# Patient Record
Sex: Female | Born: 1980 | Race: White | Hispanic: No | Marital: Married | Smoking: Former smoker
Health system: Southern US, Community
[De-identification: ages and names within clinical notes are randomized; demographics above are authoritative.]

## PROBLEM LIST (undated history)

## (undated) DIAGNOSIS — G43909 Migraine, unspecified, not intractable, without status migrainosus: Secondary | ICD-10-CM

## (undated) DIAGNOSIS — G8929 Other chronic pain: Secondary | ICD-10-CM

## (undated) DIAGNOSIS — R002 Palpitations: Secondary | ICD-10-CM

## (undated) DIAGNOSIS — F1120 Opioid dependence, uncomplicated: Secondary | ICD-10-CM

## (undated) DIAGNOSIS — K3184 Gastroparesis: Secondary | ICD-10-CM

## (undated) DIAGNOSIS — G894 Chronic pain syndrome: Secondary | ICD-10-CM

## (undated) DIAGNOSIS — R079 Chest pain, unspecified: Secondary | ICD-10-CM

## (undated) DIAGNOSIS — R160 Hepatomegaly, not elsewhere classified: Secondary | ICD-10-CM

## (undated) DIAGNOSIS — F329 Major depressive disorder, single episode, unspecified: Secondary | ICD-10-CM

## (undated) DIAGNOSIS — F1910 Other psychoactive substance abuse, uncomplicated: Secondary | ICD-10-CM

## (undated) DIAGNOSIS — F32A Depression, unspecified: Secondary | ICD-10-CM

## (undated) DIAGNOSIS — G35 Multiple sclerosis: Secondary | ICD-10-CM

## (undated) DIAGNOSIS — Q249 Congenital malformation of heart, unspecified: Secondary | ICD-10-CM

## (undated) DIAGNOSIS — I4891 Unspecified atrial fibrillation: Secondary | ICD-10-CM

## (undated) HISTORY — PX: ABDOMINAL HYSTERECTOMY: SHX81

## (undated) HISTORY — PX: CHOLECYSTECTOMY: SHX55

## (undated) HISTORY — DX: Depression, unspecified: F32.A

## (undated) HISTORY — PX: APPENDECTOMY: SHX54

## (undated) HISTORY — PX: TUBAL LIGATION: SHX77

## (undated) HISTORY — PX: JOINT REPLACEMENT: SHX530

## (undated) HISTORY — DX: Migraine, unspecified, not intractable, without status migrainosus: G43.909

## (undated) HISTORY — PX: GALLBLADDER SURGERY: SHX652

## (undated) HISTORY — DX: Congenital malformation of heart, unspecified: Q24.9

## (undated) HISTORY — PX: SALIVARY GLAND SURGERY: SHX768

## (undated) HISTORY — DX: Major depressive disorder, single episode, unspecified: F32.9

---

## 2005-02-24 ENCOUNTER — Emergency Department (HOSPITAL_COMMUNITY): Admission: EM | Admit: 2005-02-24 | Discharge: 2005-02-25 | Payer: Self-pay | Admitting: *Deleted

## 2005-04-22 ENCOUNTER — Emergency Department (HOSPITAL_COMMUNITY): Admission: EM | Admit: 2005-04-22 | Discharge: 2005-04-22 | Payer: Self-pay | Admitting: Emergency Medicine

## 2005-07-10 ENCOUNTER — Ambulatory Visit: Payer: Self-pay | Admitting: Family Medicine

## 2005-07-10 ENCOUNTER — Inpatient Hospital Stay (HOSPITAL_COMMUNITY): Admission: AD | Admit: 2005-07-10 | Discharge: 2005-07-11 | Payer: Self-pay | Admitting: *Deleted

## 2005-08-01 ENCOUNTER — Ambulatory Visit: Payer: Self-pay | Admitting: *Deleted

## 2005-08-01 ENCOUNTER — Inpatient Hospital Stay (HOSPITAL_COMMUNITY): Admission: AD | Admit: 2005-08-01 | Discharge: 2005-08-01 | Payer: Self-pay | Admitting: Obstetrics & Gynecology

## 2005-08-10 ENCOUNTER — Inpatient Hospital Stay (HOSPITAL_COMMUNITY): Admission: AD | Admit: 2005-08-10 | Discharge: 2005-08-10 | Payer: Self-pay | Admitting: Obstetrics and Gynecology

## 2005-08-10 ENCOUNTER — Ambulatory Visit: Payer: Self-pay | Admitting: *Deleted

## 2005-09-08 ENCOUNTER — Other Ambulatory Visit: Admission: RE | Admit: 2005-09-08 | Discharge: 2005-09-08 | Payer: Self-pay | Admitting: Obstetrics and Gynecology

## 2005-09-16 ENCOUNTER — Inpatient Hospital Stay (HOSPITAL_COMMUNITY): Admission: AD | Admit: 2005-09-16 | Discharge: 2005-09-16 | Payer: Self-pay | Admitting: Obstetrics and Gynecology

## 2005-10-17 ENCOUNTER — Inpatient Hospital Stay (HOSPITAL_COMMUNITY): Admission: AD | Admit: 2005-10-17 | Discharge: 2005-10-17 | Payer: Self-pay | Admitting: Obstetrics and Gynecology

## 2005-10-22 ENCOUNTER — Inpatient Hospital Stay (HOSPITAL_COMMUNITY): Admission: AD | Admit: 2005-10-22 | Discharge: 2005-10-22 | Payer: Self-pay | Admitting: Obstetrics and Gynecology

## 2005-11-02 ENCOUNTER — Inpatient Hospital Stay (HOSPITAL_COMMUNITY): Admission: AD | Admit: 2005-11-02 | Discharge: 2005-11-05 | Payer: Self-pay | Admitting: Obstetrics and Gynecology

## 2005-11-03 ENCOUNTER — Encounter (INDEPENDENT_AMBULATORY_CARE_PROVIDER_SITE_OTHER): Payer: Self-pay | Admitting: Specialist

## 2005-12-06 ENCOUNTER — Ambulatory Visit (HOSPITAL_BASED_OUTPATIENT_CLINIC_OR_DEPARTMENT_OTHER): Admission: RE | Admit: 2005-12-06 | Discharge: 2005-12-06 | Payer: Self-pay | Admitting: Obstetrics and Gynecology

## 2006-01-31 ENCOUNTER — Emergency Department (HOSPITAL_COMMUNITY): Admission: EM | Admit: 2006-01-31 | Discharge: 2006-01-31 | Payer: Self-pay | Admitting: *Deleted

## 2006-04-27 ENCOUNTER — Emergency Department (HOSPITAL_COMMUNITY): Admission: EM | Admit: 2006-04-27 | Discharge: 2006-04-27 | Payer: Self-pay | Admitting: Emergency Medicine

## 2006-07-09 ENCOUNTER — Emergency Department (HOSPITAL_COMMUNITY): Admission: EM | Admit: 2006-07-09 | Discharge: 2006-07-09 | Payer: Self-pay | Admitting: Emergency Medicine

## 2006-09-12 ENCOUNTER — Emergency Department (HOSPITAL_COMMUNITY): Admission: EM | Admit: 2006-09-12 | Discharge: 2006-09-12 | Payer: Self-pay | Admitting: Emergency Medicine

## 2006-10-17 ENCOUNTER — Inpatient Hospital Stay (HOSPITAL_COMMUNITY): Admission: AD | Admit: 2006-10-17 | Discharge: 2006-10-17 | Payer: Self-pay | Admitting: Obstetrics and Gynecology

## 2007-01-07 ENCOUNTER — Emergency Department (HOSPITAL_COMMUNITY): Admission: EM | Admit: 2007-01-07 | Discharge: 2007-01-07 | Payer: Self-pay | Admitting: Emergency Medicine

## 2007-01-09 ENCOUNTER — Emergency Department (HOSPITAL_COMMUNITY): Admission: EM | Admit: 2007-01-09 | Discharge: 2007-01-09 | Payer: Self-pay | Admitting: Emergency Medicine

## 2007-04-22 ENCOUNTER — Inpatient Hospital Stay (HOSPITAL_COMMUNITY): Admission: EM | Admit: 2007-04-22 | Discharge: 2007-04-23 | Payer: Self-pay | Admitting: Emergency Medicine

## 2007-04-22 ENCOUNTER — Ambulatory Visit: Payer: Self-pay | Admitting: Family Medicine

## 2007-06-21 ENCOUNTER — Emergency Department (HOSPITAL_COMMUNITY): Admission: EM | Admit: 2007-06-21 | Discharge: 2007-06-21 | Payer: Self-pay | Admitting: Emergency Medicine

## 2007-07-04 ENCOUNTER — Emergency Department (HOSPITAL_COMMUNITY): Admission: EM | Admit: 2007-07-04 | Discharge: 2007-07-04 | Payer: Self-pay | Admitting: Emergency Medicine

## 2007-11-05 ENCOUNTER — Emergency Department (HOSPITAL_COMMUNITY): Admission: EM | Admit: 2007-11-05 | Discharge: 2007-11-05 | Payer: Self-pay | Admitting: Emergency Medicine

## 2007-11-25 ENCOUNTER — Emergency Department (HOSPITAL_COMMUNITY): Admission: EM | Admit: 2007-11-25 | Discharge: 2007-11-25 | Payer: Self-pay | Admitting: Emergency Medicine

## 2008-01-23 ENCOUNTER — Emergency Department (HOSPITAL_COMMUNITY): Admission: EM | Admit: 2008-01-23 | Discharge: 2008-01-23 | Payer: Self-pay | Admitting: Emergency Medicine

## 2008-01-29 ENCOUNTER — Emergency Department (HOSPITAL_COMMUNITY): Admission: EM | Admit: 2008-01-29 | Discharge: 2008-01-29 | Payer: Self-pay | Admitting: Emergency Medicine

## 2008-02-11 ENCOUNTER — Emergency Department (HOSPITAL_COMMUNITY): Admission: EM | Admit: 2008-02-11 | Discharge: 2008-02-11 | Payer: Self-pay | Admitting: Emergency Medicine

## 2008-02-26 ENCOUNTER — Emergency Department (HOSPITAL_COMMUNITY): Admission: EM | Admit: 2008-02-26 | Discharge: 2008-02-26 | Payer: Self-pay | Admitting: Emergency Medicine

## 2008-05-19 ENCOUNTER — Emergency Department (HOSPITAL_BASED_OUTPATIENT_CLINIC_OR_DEPARTMENT_OTHER): Admission: EM | Admit: 2008-05-19 | Discharge: 2008-05-19 | Payer: Self-pay | Admitting: Emergency Medicine

## 2008-08-11 ENCOUNTER — Emergency Department (HOSPITAL_BASED_OUTPATIENT_CLINIC_OR_DEPARTMENT_OTHER): Admission: EM | Admit: 2008-08-11 | Discharge: 2008-08-11 | Payer: Self-pay | Admitting: Emergency Medicine

## 2008-09-18 ENCOUNTER — Emergency Department (HOSPITAL_BASED_OUTPATIENT_CLINIC_OR_DEPARTMENT_OTHER): Admission: EM | Admit: 2008-09-18 | Discharge: 2008-09-18 | Payer: Self-pay | Admitting: Emergency Medicine

## 2008-11-11 ENCOUNTER — Emergency Department (HOSPITAL_BASED_OUTPATIENT_CLINIC_OR_DEPARTMENT_OTHER): Admission: EM | Admit: 2008-11-11 | Discharge: 2008-11-11 | Payer: Self-pay | Admitting: Emergency Medicine

## 2009-01-21 ENCOUNTER — Emergency Department (HOSPITAL_COMMUNITY): Admission: EM | Admit: 2009-01-21 | Discharge: 2009-01-21 | Payer: Self-pay | Admitting: Emergency Medicine

## 2009-02-20 ENCOUNTER — Emergency Department (HOSPITAL_BASED_OUTPATIENT_CLINIC_OR_DEPARTMENT_OTHER): Admission: EM | Admit: 2009-02-20 | Discharge: 2009-02-20 | Payer: Self-pay | Admitting: Emergency Medicine

## 2009-03-02 ENCOUNTER — Emergency Department (HOSPITAL_BASED_OUTPATIENT_CLINIC_OR_DEPARTMENT_OTHER): Admission: EM | Admit: 2009-03-02 | Discharge: 2009-03-02 | Payer: Self-pay | Admitting: Emergency Medicine

## 2009-05-11 ENCOUNTER — Emergency Department (HOSPITAL_BASED_OUTPATIENT_CLINIC_OR_DEPARTMENT_OTHER): Admission: EM | Admit: 2009-05-11 | Discharge: 2009-05-11 | Payer: Self-pay | Admitting: Emergency Medicine

## 2009-05-15 ENCOUNTER — Ambulatory Visit: Payer: Self-pay | Admitting: Diagnostic Radiology

## 2009-05-15 ENCOUNTER — Emergency Department (HOSPITAL_BASED_OUTPATIENT_CLINIC_OR_DEPARTMENT_OTHER): Admission: EM | Admit: 2009-05-15 | Discharge: 2009-05-15 | Payer: Self-pay | Admitting: Emergency Medicine

## 2009-06-11 ENCOUNTER — Emergency Department (HOSPITAL_BASED_OUTPATIENT_CLINIC_OR_DEPARTMENT_OTHER): Admission: EM | Admit: 2009-06-11 | Discharge: 2009-06-11 | Payer: Self-pay | Admitting: Emergency Medicine

## 2009-06-11 ENCOUNTER — Ambulatory Visit: Payer: Self-pay | Admitting: Diagnostic Radiology

## 2009-06-30 ENCOUNTER — Ambulatory Visit: Payer: Self-pay | Admitting: Diagnostic Radiology

## 2009-06-30 ENCOUNTER — Emergency Department (HOSPITAL_BASED_OUTPATIENT_CLINIC_OR_DEPARTMENT_OTHER): Admission: EM | Admit: 2009-06-30 | Discharge: 2009-06-30 | Payer: Self-pay | Admitting: Emergency Medicine

## 2009-10-04 ENCOUNTER — Ambulatory Visit: Payer: Self-pay | Admitting: Diagnostic Radiology

## 2009-10-04 ENCOUNTER — Emergency Department (HOSPITAL_BASED_OUTPATIENT_CLINIC_OR_DEPARTMENT_OTHER): Admission: EM | Admit: 2009-10-04 | Discharge: 2009-10-04 | Payer: Self-pay | Admitting: Emergency Medicine

## 2009-12-08 ENCOUNTER — Ambulatory Visit: Payer: Self-pay | Admitting: Radiology

## 2009-12-08 ENCOUNTER — Emergency Department (HOSPITAL_BASED_OUTPATIENT_CLINIC_OR_DEPARTMENT_OTHER): Admission: EM | Admit: 2009-12-08 | Discharge: 2009-12-08 | Payer: Self-pay | Admitting: Emergency Medicine

## 2010-01-22 ENCOUNTER — Emergency Department (HOSPITAL_BASED_OUTPATIENT_CLINIC_OR_DEPARTMENT_OTHER): Admission: EM | Admit: 2010-01-22 | Discharge: 2010-01-22 | Payer: Self-pay | Admitting: Emergency Medicine

## 2010-04-04 ENCOUNTER — Emergency Department (HOSPITAL_BASED_OUTPATIENT_CLINIC_OR_DEPARTMENT_OTHER): Admission: EM | Admit: 2010-04-04 | Discharge: 2010-04-04 | Payer: Self-pay | Admitting: Emergency Medicine

## 2010-08-12 ENCOUNTER — Ambulatory Visit (HOSPITAL_COMMUNITY): Admission: RE | Admit: 2010-08-12 | Discharge: 2010-08-12 | Payer: Self-pay | Admitting: Family Medicine

## 2010-09-30 ENCOUNTER — Emergency Department (HOSPITAL_BASED_OUTPATIENT_CLINIC_OR_DEPARTMENT_OTHER): Admission: EM | Admit: 2010-09-30 | Discharge: 2010-09-30 | Payer: Self-pay | Admitting: Emergency Medicine

## 2010-11-04 ENCOUNTER — Emergency Department (HOSPITAL_COMMUNITY)
Admission: EM | Admit: 2010-11-04 | Discharge: 2010-11-04 | Payer: Self-pay | Source: Home / Self Care | Admitting: Emergency Medicine

## 2010-12-12 ENCOUNTER — Encounter: Payer: Self-pay | Admitting: Orthopedic Surgery

## 2010-12-13 ENCOUNTER — Ambulatory Visit: Payer: Self-pay | Admitting: Cardiology

## 2010-12-21 ENCOUNTER — Emergency Department (INDEPENDENT_AMBULATORY_CARE_PROVIDER_SITE_OTHER)
Admission: EM | Admit: 2010-12-21 | Discharge: 2010-12-21 | Payer: Self-pay | Source: Home / Self Care | Admitting: Emergency Medicine

## 2010-12-21 DIAGNOSIS — H9209 Otalgia, unspecified ear: Secondary | ICD-10-CM

## 2010-12-21 DIAGNOSIS — R0989 Other specified symptoms and signs involving the circulatory and respiratory systems: Secondary | ICD-10-CM

## 2010-12-21 DIAGNOSIS — R05 Cough: Secondary | ICD-10-CM

## 2011-01-31 LAB — URINALYSIS, ROUTINE W REFLEX MICROSCOPIC
Bilirubin Urine: NEGATIVE
Glucose, UA: NEGATIVE mg/dL
Hgb urine dipstick: NEGATIVE
Ketones, ur: NEGATIVE mg/dL
Protein, ur: NEGATIVE mg/dL
Urobilinogen, UA: 0.2 mg/dL (ref 0.0–1.0)

## 2011-01-31 LAB — CBC
Hemoglobin: 13 g/dL (ref 12.0–15.0)
MCHC: 33.2 g/dL (ref 30.0–36.0)
RDW: 14.1 % (ref 11.5–15.5)

## 2011-01-31 LAB — POCT I-STAT, CHEM 8
Chloride: 109 mEq/L (ref 96–112)
Hemoglobin: 13.6 g/dL (ref 12.0–15.0)
Potassium: 4.5 mEq/L (ref 3.5–5.1)
Sodium: 141 mEq/L (ref 135–145)
TCO2: 24 mmol/L (ref 0–100)

## 2011-01-31 LAB — DIFFERENTIAL
Basophils Absolute: 0.1 10*3/uL (ref 0.0–0.1)
Basophils Relative: 1 % (ref 0–1)
Neutro Abs: 5 10*3/uL (ref 1.7–7.7)
Neutrophils Relative %: 49 % (ref 43–77)

## 2011-02-06 LAB — URINE MICROSCOPIC-ADD ON

## 2011-02-06 LAB — COMPREHENSIVE METABOLIC PANEL
Albumin: 4.6 g/dL (ref 3.5–5.2)
BUN: 9 mg/dL (ref 6–23)
Calcium: 9 mg/dL (ref 8.4–10.5)
Chloride: 104 mEq/L (ref 96–112)
Creatinine, Ser: 0.7 mg/dL (ref 0.4–1.2)
GFR calc non Af Amer: >60 mL/min (ref >60)
Total Bilirubin: 0.3 mg/dL (ref 0.3–1.2)

## 2011-02-06 LAB — URINALYSIS, ROUTINE W REFLEX MICROSCOPIC
Leukocytes, UA: NEGATIVE
Nitrite: NEGATIVE
Specific Gravity, Urine: 1.014 (ref 1.005–1.030)
pH: 6.5 (ref 5.0–8.0)

## 2011-02-06 LAB — DIFFERENTIAL
Basophils Absolute: 0.1 10*3/uL (ref 0.0–0.1)
Lymphocytes Relative: 29 % (ref 12–46)
Lymphs Abs: 3.2 10*3/uL (ref 0.7–4.0)
Monocytes Absolute: 0.8 10*3/uL (ref 0.1–1.0)
Neutro Abs: 6.6 10*3/uL (ref 1.7–7.7)

## 2011-02-06 LAB — CBC
HCT: 41.2 % (ref 36.0–46.0)
MCHC: 34 g/dL (ref 30.0–36.0)
MCV: 88.4 fL (ref 78.0–100.0)
Platelets: 253 10*3/uL (ref 150–400)
WBC: 11.1 10*3/uL — ABNORMAL HIGH (ref 4.0–10.5)

## 2011-02-26 LAB — URINALYSIS, ROUTINE W REFLEX MICROSCOPIC
Bilirubin Urine: NEGATIVE
Ketones, ur: NEGATIVE mg/dL
Nitrite: NEGATIVE
Protein, ur: NEGATIVE mg/dL
Urobilinogen, UA: 1 mg/dL (ref 0.0–1.0)

## 2011-02-26 LAB — COMPREHENSIVE METABOLIC PANEL
AST: 35 U/L (ref 0–37)
CO2: 32 mEq/L (ref 19–32)
Chloride: 105 mEq/L (ref 96–112)
Creatinine, Ser: 0.7 mg/dL (ref 0.4–1.2)
GFR calc Af Amer: >60 mL/min (ref >60)
GFR calc non Af Amer: >60 mL/min (ref >60)
Total Bilirubin: 0.2 mg/dL — ABNORMAL LOW (ref 0.3–1.2)

## 2011-02-26 LAB — DIFFERENTIAL
Basophils Absolute: 0.1 10*3/uL (ref 0.0–0.1)
Lymphocytes Relative: 45 % (ref 12–46)
Monocytes Absolute: 0.9 10*3/uL (ref 0.1–1.0)
Neutro Abs: 3.7 10*3/uL (ref 1.7–7.7)

## 2011-02-26 LAB — LIPASE, BLOOD: Lipase: 92 U/L (ref 23–300)

## 2011-02-26 LAB — CBC
HCT: 40.9 % (ref 36.0–46.0)
MCV: 88.8 fL (ref 78.0–100.0)
RBC: 4.61 MIL/uL (ref 3.87–5.11)
WBC: 9.9 10*3/uL (ref 4.0–10.5)

## 2011-02-26 LAB — PREGNANCY, URINE: Preg Test, Ur: NEGATIVE

## 2011-02-26 LAB — URINE CULTURE: Colony Count: 5000

## 2011-02-27 LAB — DIFFERENTIAL
Basophils Absolute: 0.1 10*3/uL (ref 0.0–0.1)
Eosinophils Absolute: 0.5 10*3/uL (ref 0.0–0.7)
Eosinophils Relative: 6 % — ABNORMAL HIGH (ref 0–5)
Monocytes Absolute: 1 10*3/uL (ref 0.1–1.0)

## 2011-02-27 LAB — COMPREHENSIVE METABOLIC PANEL
ALT: 14 U/L (ref 0–35)
AST: 30 U/L (ref 0–37)
CO2: 29 mEq/L (ref 19–32)
Chloride: 105 mEq/L (ref 96–112)
GFR calc Af Amer: >60 mL/min (ref >60)
GFR calc non Af Amer: >60 mL/min (ref >60)
Potassium: 4.1 mEq/L (ref 3.5–5.1)
Sodium: 139 mEq/L (ref 135–145)
Total Bilirubin: 0.2 mg/dL — ABNORMAL LOW (ref 0.3–1.2)

## 2011-02-27 LAB — D-DIMER, QUANTITATIVE: D-Dimer, Quant: <0.22 ug/mL-FEU (ref 0.00–0.48)

## 2011-02-27 LAB — CBC
RBC: 4.85 MIL/uL (ref 3.87–5.11)
WBC: 8.8 10*3/uL (ref 4.0–10.5)

## 2011-02-28 LAB — CBC
HCT: 39 % (ref 36.0–46.0)
HCT: 41.8 % (ref 36.0–46.0)
Hemoglobin: 14.4 g/dL (ref 12.0–15.0)
MCHC: 35.2 g/dL (ref 30.0–36.0)
MCV: 89 fL (ref 78.0–100.0)
Platelets: 242 10*3/uL (ref 150–400)
RBC: 4.7 MIL/uL (ref 3.87–5.11)
RDW: 13.3 % (ref 11.5–15.5)
WBC: 12 10*3/uL — ABNORMAL HIGH (ref 4.0–10.5)

## 2011-02-28 LAB — URINALYSIS, ROUTINE W REFLEX MICROSCOPIC
Bilirubin Urine: NEGATIVE
Ketones, ur: NEGATIVE mg/dL
Protein, ur: NEGATIVE mg/dL
Urobilinogen, UA: 0.2 mg/dL (ref 0.0–1.0)

## 2011-02-28 LAB — BASIC METABOLIC PANEL
Chloride: 102 mEq/L (ref 96–112)
GFR calc Af Amer: >60 mL/min (ref >60)
GFR calc non Af Amer: >60 mL/min (ref >60)
Potassium: 3.9 mEq/L (ref 3.5–5.1)
Sodium: 141 mEq/L (ref 135–145)

## 2011-02-28 LAB — URINE MICROSCOPIC-ADD ON

## 2011-02-28 LAB — DIFFERENTIAL
Basophils Absolute: 0.1 10*3/uL (ref 0.0–0.1)
Basophils Relative: 1 % (ref 0–1)
Eosinophils Absolute: 0.4 10*3/uL (ref 0.0–0.7)
Eosinophils Relative: 3 % (ref 0–5)
Eosinophils Relative: 5 % (ref 0–5)
Lymphocytes Relative: 30 % (ref 12–46)
Lymphocytes Relative: 37 % (ref 12–46)
Lymphs Abs: 3.6 10*3/uL (ref 0.7–4.0)
Monocytes Relative: 6 % (ref 3–12)
Neutro Abs: 4.3 10*3/uL (ref 1.7–7.7)

## 2011-02-28 LAB — RAPID STREP SCREEN (MED CTR MEBANE ONLY): Streptococcus, Group A Screen (Direct): NEGATIVE

## 2011-03-03 LAB — COMPREHENSIVE METABOLIC PANEL
ALT: 19 U/L (ref 0–35)
AST: 19 U/L (ref 0–37)
Albumin: 3.9 g/dL (ref 3.5–5.2)
Alkaline Phosphatase: 72 U/L (ref 39–117)
BUN: 9 mg/dL (ref 6–23)
CO2: 27 mEq/L (ref 19–32)
Calcium: 9.5 mg/dL (ref 8.4–10.5)
Chloride: 104 mEq/L (ref 96–112)
Creatinine, Ser: 0.6 mg/dL (ref 0.4–1.2)
GFR calc Af Amer: >60 mL/min (ref >60)
GFR calc non Af Amer: >60 mL/min (ref >60)
Glucose, Bld: 96 mg/dL (ref 70–99)
Potassium: 3.7 mEq/L (ref 3.5–5.1)
Sodium: 139 mEq/L (ref 135–145)
Total Bilirubin: 0.6 mg/dL (ref 0.3–1.2)
Total Protein: 6.7 g/dL (ref 6.0–8.3)

## 2011-03-03 LAB — URINE MICROSCOPIC-ADD ON

## 2011-03-03 LAB — DIFFERENTIAL
Basophils Absolute: 0 10*3/uL (ref 0.0–0.1)
Basophils Relative: 1 % (ref 0–1)
Eosinophils Absolute: 0.3 10*3/uL (ref 0.0–0.7)
Eosinophils Relative: 3 % (ref 0–5)
Lymphocytes Relative: 27 % (ref 12–46)
Lymphs Abs: 2.6 10*3/uL (ref 0.7–4.0)
Monocytes Absolute: 0.9 10*3/uL (ref 0.1–1.0)
Monocytes Relative: 9 % (ref 3–12)
Neutro Abs: 5.9 10*3/uL (ref 1.7–7.7)
Neutrophils Relative %: 61 % (ref 43–77)

## 2011-03-03 LAB — WET PREP, GENITAL
Trich, Wet Prep: NONE SEEN
Yeast Wet Prep HPF POC: NONE SEEN

## 2011-03-03 LAB — CBC
HCT: 40.5 % (ref 36.0–46.0)
Hemoglobin: 13.7 g/dL (ref 12.0–15.0)
MCHC: 33.8 g/dL (ref 30.0–36.0)
MCV: 89.6 fL (ref 78.0–100.0)
Platelets: 337 10*3/uL (ref 150–400)
RBC: 4.52 MIL/uL (ref 3.87–5.11)
RDW: 14.7 % (ref 11.5–15.5)
WBC: 9.7 10*3/uL (ref 4.0–10.5)

## 2011-03-03 LAB — URINALYSIS, ROUTINE W REFLEX MICROSCOPIC
Bilirubin Urine: NEGATIVE
Glucose, UA: NEGATIVE mg/dL
Hgb urine dipstick: NEGATIVE
Ketones, ur: NEGATIVE mg/dL
Leukocytes, UA: NEGATIVE
Nitrite: NEGATIVE
Protein, ur: NEGATIVE mg/dL
Specific Gravity, Urine: 1.018 (ref 1.005–1.030)
Urobilinogen, UA: 0.2 mg/dL (ref 0.0–1.0)
pH: 7.5 (ref 5.0–8.0)

## 2011-03-03 LAB — POCT I-STAT, CHEM 8
BUN: 12 mg/dL (ref 6–23)
Calcium, Ion: 1.19 mmol/L (ref 1.12–1.32)
Creatinine, Ser: 0.6 mg/dL (ref 0.4–1.2)
Glucose, Bld: 84 mg/dL (ref 70–99)
Sodium: 140 mEq/L (ref 135–145)
TCO2: 27 mmol/L (ref 0–100)

## 2011-04-05 NOTE — Discharge Summary (Signed)
[name], Teresa Ochoa              ACCOUNT NO.:  [id number]   MEDICAL RECORD NO.:  [id number]          PATIENT TYPE:  INP   LOCATION:  5713                         FACILITY:  [facility]   PHYSICIAN:  Levander Campion, M.D.  DATE OF BIRTH:  1981/09/16   DATE OF ADMISSION:  04/22/2007  DATE OF DISCHARGE:  04/23/2007                               DISCHARGE SUMMARY   DISCHARGE DIAGNOSES:  1. Constipation.  2. Anxiety.  3. Panic attacks.   DISCHARGE MEDICATIONS:  1. MiraLax 17 g in 8 ounces of clear liquid twice daily.  The patient      was instructed that she can titrate this up or down as needed to      regulate her bowel movements.  2. Percocet 5/325 one to two tabs every 4 to 6 hours as needed for      pain.  3. Ibuprofen 800 mg 3 times daily as needed for pain.  4. Nicotine patch.  Apply new patch daily.  5. Klonopin 0.5 mg daily as needed for anxiety.   DISCHARGE INSTRUCTIONS:  The patient was discharged home with  instructions to comply with a high-fiber diet.  She is to follow up with  [facility] within 2 weeks of discharge.   STUDIES:  CT of pelvis and abdomen was unremarkable except for a copious  amount of stool.   LABORATORY DATA:  On admission, her sed rate was 8.  The patient was  fecal occult blood negative.  She had a white blood count of 13,  hemoglobin of 14, hematocrit of 42.1, platelets of 300.  Lipase of 25.  Sodium 138, potassium 4.5, chloride 101, bicarbonate of 25, BUN of 10,  creatinine of 0.6, glucose of 117.  UA was within normal limits.   HOSPITAL COURSE:  Ms. Harle Battiest is a 30 year old female who has a history  of constipation and anxiety, who presented with abdominal pain.  The  patient had negative KUB and negative CT scan.  The patient's CT scan  did show a copious amount of stool in the colon, and the patient had  been on a pain medication regimen that could contribute to constipation.  The patient was given multiple milk of molasses  enema and MiraLax  titrated up to 17 g b.i.d. and clear liquid diet.  After this bowel  regimen, she successfully had multiple bowel movements with relief of  her pain, and she was discharged home with instructions to follow a high-  fiber diet and to use MiraLax to adjust her bowel movement pattern.   1. Anxiety:  The patient was continued on her home anti-anxiety      medication of Klonopin.   1. Tobacco abuse:  Smoking cessation was addressed during this      hospital stay, and the patient was discharged home with a nicotine      patch.      Levander Campion, M.D.  Electronically Signed     JH/MEDQ  D:  07/02/2007  T:  07/02/2007  Job:  [id number]

## 2011-04-08 NOTE — Op Note (Signed)
NAME:  LONDYNN, SONODA              ACCOUNT NO.:  [id number]   MEDICAL RECORD NO.:  [id number]          PATIENT TYPE:  AMB   LOCATION:  NESC                         FACILITY:  [facility]   PHYSICIAN:  Malachi Pro. Ambrose Mantle, M.D. DATE OF BIRTH:  1981/01/02   DATE OF PROCEDURE:  12/06/2005  DATE OF DISCHARGE:                                 OPERATIVE REPORT   PREOPERATIVE DIAGNOSIS:  Voluntary sterilization.   POSTOPERATIVE DIAGNOSIS:  Voluntary sterilization.   OPERATION:  Laparoscopic tubal cauterization by open laparoscopy.   OPERATOR:  Malachi Pro. Ambrose Mantle, M.D.   ANESTHESIA:  General anesthesia.   The patient was brought to the operating room and placed under satisfactory  general anesthesia and placed in [place] stirrups. The abdomen, vulva, vagina  and urethra were prepped with Betadine solution and the bladder was emptied  with a [place] catheter. A Hulka cannula was placed into the uterus and  attached to the anterior cervical lip. The uterus was anterior upper limit  of normal size because of her recent postpartum state. The adnexa were free  of masses. The abdomen was then draped as a sterile field. A small incision  was made in the inferior portion of the umbilicus, taken down to the fascia.  The fascia was elevated with Kocher clamps, incised and then the peritoneum  was opened with my finger. I inserted the Hassan cannula, filled the abdomen  with CO2 and had good visualization of the pelvic contents. The uterus was  anterior upper limit of normal size. The cul-de-sacs were free of disease.  Both tubes and ovaries were normal. The right tube was grasped and  cauterized in four consecutive locations starting at the mid portion of the  tube and going toward the cornu with a setting of 4 and the cauterization  was continued until the meter read zero. The same procedure was done on the  left side, no complications were encountered. The laparoscope was turned  superiorly, there were  adhesions of what appeared to be the cecal area to  the abdominal wall. These were not ligated or divided. The liver looks  smooth. The gallbladder appeared normal. No upper abdominal abnormalities  were noted. The small trocar placed under direct vision suprapubically prior  to the cauterization was removed under direct vision, there was no bleeding.  I then removed the open laparoscopic trocar and pulled out the pursestring  suture of #0 Vicryl that had been used to anchor the McGuffey and closed the  abdominal fascia with three figure-of-eight sutures of #0 Vicryl.   The subcu tissue was then closed with 3-0 Vicryl. The skin was closed with  interrupted 3-0 plain catgut. The patient seemed to tolerate the procedure  well. Blood loss was less than 10 mL. Sponge and needle counts were correct  and the patient was returned to recovery in satisfactory condition.      Malachi Pro. Ambrose Mantle, M.D.  Electronically Signed    TFH/MEDQ  D:  12/06/2005  T:  12/06/2005  Job:  [id number]

## 2011-04-08 NOTE — Discharge Summary (Signed)
NAMEMICHALLA, RINGER              ACCOUNT NO.:  [id number]   MEDICAL RECORD NO.:  [id number]          PATIENT TYPE:  INP   LOCATION:  9123                          FACILITY:  [facility]   PHYSICIAN:  Huel Cote, M.D. DATE OF BIRTH:  [birth year]   DATE OF ADMISSION:  11/02/2005  DATE OF DISCHARGE:  11/05/2005                                 DISCHARGE SUMMARY   DISCHARGE MEDICATIONS:  1.  Motrin 600 mg p.o. every six hours.  2.  Percocet one to two tablets p.o. every four hours p.r.n.   DISCHARGE FOLLOWUP:  The patient is to follow up in the office in six weeks  for her routine postpartum exam   HOSPITAL COURSE:  The patient is 30 year old, G9, P2-0-5-2, who was admitted  at 20 weeks' gestation by her only possible date of conception with an [facility]  of November 01, 2005.  She had two later ultrasounds at 13 and 20 weeks  which made her approximately 37 to 38 weeks' gestation.  The patient is  miserable with pain and pressure and had to arrange for childcare 200 miles  away for delivery and therefore, requested with a social induction which was  granted.  The patient transferred into our care at 28 weeks' gestation and  prenatal care was essentially uncomplicated, except for her severe pain and  pressure.  Prenatal labs as follows:  A positive.  RPR nonreactive.  Rubella immune.  Hepatitis B surface antigen negative.  HIV negative. GC and Chlamydia  negative.  One-hour Glucola 95 and GBS positive.   PAST OBSTETRICAL HISTORY:  1.  She had had five spontaneous miscarriages.  2.  She had two spontaneous vaginal deliveries, one 7 pounds 11 ounces, one      10 pounds 3 ounces.  3.  She also had a preterm IUFD at 27 weeks which was a vaginal delivery.   PAST MEDICAL HISTORY:  1.  UTIs.  2.  Anxiety.  3.  Depression.  4.  Migraines.   PAST SURGICAL HISTORY:  Appendectomy.   ALLERGIES:  MACROBID.   MEDICATIONS:  Darvocet.   PHYSICAL EXAMINATION:  VITAL SIGNS:  She was afebrile with  stable vital  signs.  Fetal heart tones were reactive.  PELVIC:  Cervix was 31 at a -3.   She was admitted for delivery and I explained the risk of possible fetal  lung immaturity should her estimated due date really be closer to 37 weeks,  and was admitted for Cervidil ripening.  The patient did well overnight and  cervix the a.m. following Cervidil ripening was 2-to-3-cm.  She had rupture  of membranes performed with clear fluid noted and was placed on Pitocin.  She then progressed to complete dilation and delivered a spontaneous vaginal  delivery over an intact perineum, a female infant 7 pounds 11 ounces,  Apgars were 7 and 8.  Placenta delivered intact.  The cord pH was noted to  be 7.31.  The patient requested bilateral tubal ligation, however, had not signed her  Medicaid papers in a timely fashion and therefore, this was postponed until  her six-week appointment.  She was admitted for routine postpartum care.  She did very well.  On  postpartum day #2, she was doing well and felt stable for discharge home.  Her hemoglobin was 8.6 and she had no further complaints; therefore, she was  discharged home with prescriptions for Motrin and Percocet and will follow  up in the office in six weeks.      Huel Cote, M.D.  Electronically Signed     KR/MEDQ  D:  11/05/2005  T:  11/06/2005  Job:  [id number]

## 2011-04-08 NOTE — H&P (Signed)
Teresa Ochoa, Teresa Ochoa              ACCOUNT NO.:  [id number]   MEDICAL RECORD NO.:  [id number]          PATIENT TYPE:  INP   LOCATION:  9123                          FACILITY:  [facility]   PHYSICIAN:  Malachi Pro. Ambrose Mantle, M.D. DATE OF BIRTH:  December 30, 1980   DATE OF ADMISSION:  11/02/2005  DATE OF DISCHARGE:                                HISTORY & PHYSICAL   HISTORY OF PRESENT ILLNESS:  The patient is a 30 year old white female, para  2-1-5-2, gravida 60, who was admitted by Dr. Jackelyn Knife for induction of labor  because of multiple social problems. Her blood group and type was A  positive, negative antibody, RPR non-reactive, rubella immune, hepatitis B  surface antigen negative, HIV negative, GC and Chlamydia negative.  One-hour  Glucola 95, group B strep positive.  The patient transferred to our group at  28 weeks, essentially uncomplicated except for recent severe pain and  pressure.  She had a history of five spontaneous abortions, spontaneous  vaginal delivery at term x2, 7 pounds 1 ounce and 10 pounds 3 ounces.  Preterm IUFD at 27 weeks.   PAST MEDICAL HISTORY:  1.  Urinary tract infection.  2.  Anxiety and depression.  3.  Migraines.   PAST SURGICAL HISTORY:  Appendectomy.   ALLERGIES:  MACROBID.   MEDICATIONS:  Darvocet.   PHYSICAL EXAMINATION:  VITAL SIGNS:  No admission the patient was afebrile  with normal vital signs.  HEART AND LUNGS:  Normal.  ABDOMEN:  Gravid.  Fetal heart tones normal.  PELVIC:  Cervix was 1 cm.   IMPRESSION:  Intrauterine pregnancy at 40 weeks by patient reported date of  conception, only 37 weeks or so by 16-week and 20-week ultrasounds, admitted  for social induction. Cervix was unfavorable.  The patient was ripened with  Cervidil.  The patient was explained the small risk of fetal lung  immaturity.  The patient desired tubal ligation.  The patient was treated  with penicillin prophylactically for the group B strep.   She began labor after the  Cervidil was removed and Pitocin was begun.  She  progressed to full dilatation and delivered spontaneously a living female  infant, 7 pounds 11 ounces without Apgars of 7 at one minute and 8 at five  minutes.  The arterial cord PH was 7.31.  Placenta was intact.  Uterus  normal.  The patient was counseled extensively about tubal ligation. She  requested to proceed with the tubal ligation.   ADMITTING IMPRESSION:  1.  Intrauterine pregnancy at 40 weeks by her history, delivered OA.  2.  Voluntary sterilization.  The patient will undergo tubal ligation.      Malachi Pro. Ambrose Mantle, M.D.  Electronically Signed     TFH/MEDQ  D:  11/04/2005  T:  11/04/2005  Job:  [id number]

## 2011-05-14 ENCOUNTER — Emergency Department (HOSPITAL_BASED_OUTPATIENT_CLINIC_OR_DEPARTMENT_OTHER)
Admission: EM | Admit: 2011-05-14 | Discharge: 2011-05-14 | Disposition: A | Discharge status: Home / Self Care | Payer: Medicaid Other | Attending: Emergency Medicine | Admitting: Emergency Medicine

## 2011-05-14 ENCOUNTER — Emergency Department (INDEPENDENT_AMBULATORY_CARE_PROVIDER_SITE_OTHER): Payer: Medicaid Other

## 2011-05-14 DIAGNOSIS — F17200 Nicotine dependence, unspecified, uncomplicated: Secondary | ICD-10-CM | POA: Insufficient documentation

## 2011-05-14 DIAGNOSIS — R1011 Right upper quadrant pain: Secondary | ICD-10-CM

## 2011-05-14 DIAGNOSIS — G8929 Other chronic pain: Secondary | ICD-10-CM | POA: Insufficient documentation

## 2011-05-14 DIAGNOSIS — R0789 Other chest pain: Secondary | ICD-10-CM

## 2011-05-14 DIAGNOSIS — K5900 Constipation, unspecified: Secondary | ICD-10-CM | POA: Insufficient documentation

## 2011-05-14 DIAGNOSIS — R109 Unspecified abdominal pain: Secondary | ICD-10-CM | POA: Insufficient documentation

## 2011-05-14 DIAGNOSIS — Z79899 Other long term (current) drug therapy: Secondary | ICD-10-CM | POA: Insufficient documentation

## 2011-05-14 DIAGNOSIS — R110 Nausea: Secondary | ICD-10-CM

## 2011-05-14 DIAGNOSIS — G35 Multiple sclerosis: Secondary | ICD-10-CM | POA: Insufficient documentation

## 2011-05-14 DIAGNOSIS — E039 Hypothyroidism, unspecified: Secondary | ICD-10-CM | POA: Insufficient documentation

## 2011-06-20 ENCOUNTER — Encounter: Payer: Self-pay | Admitting: *Deleted

## 2011-06-20 ENCOUNTER — Emergency Department (HOSPITAL_BASED_OUTPATIENT_CLINIC_OR_DEPARTMENT_OTHER)
Admission: EM | Admit: 2011-06-20 | Discharge: 2011-06-20 | Disposition: A | Discharge status: Home / Self Care | Payer: Medicaid Other | Attending: Emergency Medicine | Admitting: Emergency Medicine

## 2011-06-20 ENCOUNTER — Emergency Department (INDEPENDENT_AMBULATORY_CARE_PROVIDER_SITE_OTHER): Payer: Medicaid Other

## 2011-06-20 DIAGNOSIS — R51 Headache: Secondary | ICD-10-CM

## 2011-06-20 DIAGNOSIS — R0602 Shortness of breath: Secondary | ICD-10-CM | POA: Insufficient documentation

## 2011-06-20 DIAGNOSIS — F17200 Nicotine dependence, unspecified, uncomplicated: Secondary | ICD-10-CM | POA: Insufficient documentation

## 2011-06-20 DIAGNOSIS — G35 Multiple sclerosis: Secondary | ICD-10-CM | POA: Insufficient documentation

## 2011-06-20 HISTORY — DX: Multiple sclerosis: G35

## 2011-06-20 MED ORDER — ONDANSETRON HCL 4 MG/2ML IJ SOLN
4.0000 mg | Freq: Once | INTRAMUSCULAR | Status: AC
  Administered 2011-06-20: 4 mg via INTRAVENOUS
  Filled 2011-06-20: qty 2

## 2011-06-20 MED ORDER — HYDROMORPHONE HCL 1 MG/ML IJ SOLN
1.0000 mg | Freq: Once | INTRAMUSCULAR | Status: AC
  Administered 2011-06-20: 1 mg via INTRAVENOUS
  Filled 2011-06-20: qty 1

## 2011-06-20 MED ORDER — SODIUM CHLORIDE 0.9 % IV BOLUS (SEPSIS)
1000.0000 mL | Freq: Once | INTRAVENOUS | Status: AC
  Administered 2011-06-20: 1000 mL via INTRAVENOUS

## 2011-06-20 MED ORDER — METHYLPREDNISOLONE SODIUM SUCC 125 MG IJ SOLR
125.0000 mg | Freq: Once | INTRAMUSCULAR | Status: AC
  Administered 2011-06-20: 125 mg via INTRAVENOUS
  Filled 2011-06-20: qty 2

## 2011-06-20 MED ORDER — PREDNISONE 10 MG PO TABS: 20.0000 mg | ORAL_TABLET | Freq: Every day | ORAL | Status: AC

## 2011-06-20 NOTE — ED Notes (Signed)
Pt requested/given peanut butter/crackers/water-NAD

## 2011-06-20 NOTE — ED Notes (Signed)
Pt c/o h/a and SOB x 4 days. Pt also states confusion at times.  HX migraines

## 2011-06-20 NOTE — ED Provider Notes (Addendum)
History     Chief Complaint  Patient presents with  . Headache  . Shortness of Breath   Patient is a 30 y.o. female presenting with headaches.  Headache  This is a chronic problem. The current episode started 12 to 24 hours ago. The problem occurs constantly. The problem has been gradually worsening. The headache is associated with nothing. The pain is located in the right unilateral region. The quality of the pain is described as sharp. The pain is at a severity of 7/10. The pain is moderate. The pain does not radiate. She has tried nothing for the symptoms. The treatment provided moderate relief.  Pt has MS.  Pt reports she has had increased tingling in extremities.  Pt has had some problems with forgetfulness recently.  Pt is scheduled to see Dr. Sandria Manly for evaluation.  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  Past Medical History  Diagnosis Date  . Migraine   . MS (multiple sclerosis)     Past Surgical History  Procedure Date  . Tubal ligation   . Abdominal hysterectomy   . Appendectomy   . Salivary gland surgery   . Joint replacement     History reviewed. No pertinent family history.  History  Substance Use Topics  . Smoking status: Current Everyday Smoker -- 1.0 packs/day  . Smokeless tobacco: Not on file  . Alcohol Use: No    OB History    Grav Para Term Preterm Abortions TAB SAB Ect Mult Living                  Review of Systems  Neurological: Positive for light-headedness, numbness and headaches. Negative for dizziness, seizures, syncope, speech difficulty and weakness.  All other systems reviewed and are negative.    Physical Exam  BP 108/64  Pulse 67  Temp 98.8 F (37.1 C)  Resp 16  Wt 180 lb (81.647 kg)  SpO2 100%  Physical Exam  Constitutional: She is oriented to person, place, and time. She appears well-developed and well-nourished.  HENT:  Head: Normocephalic and atraumatic.  Eyes: Conjunctivae and EOM are normal.  Pupils are equal, round, and reactive to light.  Neck: Normal range of motion. Neck supple.  Cardiovascular: Normal rate and regular rhythm.   Pulmonary/Chest: Effort normal.  Abdominal: Soft. Bowel sounds are normal.  Musculoskeletal: Normal range of motion.  Neurological: She is alert and oriented to person, place, and time. She has normal reflexes.  Skin: Skin is warm and dry.  Psychiatric: She has a normal mood and affect.    ED Course  Procedures  MDM Pt has a normal Ct,  Pt feels better after medication,  I will given solumedrol Iv and place on prednisone   Medical screening examination/treatment/procedure(s) were performed by non-physician practitioner and as supervising physician I was immediately available for consultation/collaboration. Osvaldo Human, M.D.    [place], [place] 06/20/11 2313  Carleene Cooper III, MD 06/21/11 1115  Carleene Cooper III, MD 07/29/11 [phone]

## 2011-06-20 NOTE — ED Notes (Signed)
C/o HA, increased fatigue-pt NAD at present

## 2011-07-07 ENCOUNTER — Other Ambulatory Visit: Payer: Self-pay

## 2011-07-07 ENCOUNTER — Emergency Department (HOSPITAL_BASED_OUTPATIENT_CLINIC_OR_DEPARTMENT_OTHER)
Admission: EM | Admit: 2011-07-07 | Discharge: 2011-07-07 | Disposition: A | Discharge status: Home / Self Care | Payer: Medicaid Other | Attending: Emergency Medicine | Admitting: Emergency Medicine

## 2011-07-07 ENCOUNTER — Encounter (HOSPITAL_BASED_OUTPATIENT_CLINIC_OR_DEPARTMENT_OTHER): Payer: Self-pay | Admitting: *Deleted

## 2011-07-07 DIAGNOSIS — R000 Tachycardia, unspecified: Secondary | ICD-10-CM | POA: Insufficient documentation

## 2011-07-07 DIAGNOSIS — G35 Multiple sclerosis: Secondary | ICD-10-CM | POA: Insufficient documentation

## 2011-07-07 DIAGNOSIS — R51 Headache: Secondary | ICD-10-CM | POA: Insufficient documentation

## 2011-07-07 MED ORDER — HYDROCODONE-ACETAMINOPHEN 5-325 MG PO TABS: 1.0000 | ORAL_TABLET | ORAL | Status: AC | PRN

## 2011-07-07 MED ORDER — DEXAMETHASONE SODIUM PHOSPHATE 10 MG/ML IJ SOLN
10.0000 mg | Freq: Once | INTRAMUSCULAR | Status: AC
  Administered 2011-07-07: 10 mg via INTRAVENOUS
  Filled 2011-07-07: qty 1

## 2011-07-07 MED ORDER — SODIUM CHLORIDE 0.9 % IV BOLUS (SEPSIS)
1000.0000 mL | Freq: Once | INTRAVENOUS | Status: AC
  Administered 2011-07-07: 1000 mL via INTRAVENOUS

## 2011-07-07 MED ORDER — METOCLOPRAMIDE HCL 5 MG/ML IJ SOLN
10.0000 mg | Freq: Once | INTRAMUSCULAR | Status: AC
  Administered 2011-07-07: 10 mg via INTRAVENOUS
  Filled 2011-07-07: qty 2

## 2011-07-07 NOTE — ED Notes (Signed)
Pt presents to ED today with continued migraine HA for the last month.  Pt followed up with neurologist and stated "everything normal"  Pt taking advil and excedrin.  Pt states "I forgot things and pain is mainly in back of head"

## 2011-07-07 NOTE — ED Provider Notes (Signed)
History     CSN: [id number] Arrival date & time: 07/07/2011  7:43 PM  Chief Complaint  Patient presents with  . Migraine   HPI Pt w/ possible h/o MS c/o constant, sharp, right-sided headache x 4 weeks.  No relief w/ excedrin.  Per prior chart, seen for same on 06/20/11.  At that time, she reported that the symptoms are chronic but gradually worsening.  CT head neg, symptoms improved w/ IV steroids and dilaudid and pt referred to Dr. Marjory Lies who she followed up with the following week.  Pain associated w/ blurred vision, disorientation to place and intermittent numbness of right face.  Has not had dizziness, ataxia, weakness.  Has chronic and unchanged numbness in hands.  Denies fever.  Denies head trauma.  Has had migraines for several years and this feels different.   Past Medical History  Diagnosis Date  . Migraine   . MS (multiple sclerosis)     Past Surgical History  Procedure Date  . Tubal ligation   . Abdominal hysterectomy   . Appendectomy   . Salivary gland surgery   . Joint replacement     History reviewed. No pertinent family history.  History  Substance Use Topics  . Smoking status: Current Everyday Smoker -- 1.0 packs/day  . Smokeless tobacco: Not on file  . Alcohol Use: No    OB History    Grav Para Term Preterm Abortions TAB SAB Ect Mult Living                  Review of Systems  Cardiovascular: Positive for palpitations.       Heart feels like its racing and associated w/ CP.   All other systems reviewed and are negative.    Physical Exam  BP 113/65  Pulse 85  Temp(Src) 98 F (36.7 C) (Oral)  Resp 16  SpO2 97%  Physical Exam  Nursing note and vitals reviewed. Constitutional: She is oriented to person, place, and time. She appears well-developed and well-nourished. No distress.       Uncomfortable appearing  HENT:  Head: Normocephalic and atraumatic.  Eyes:       Normal appearance  Neck: Normal range of motion and full passive range of  motion without pain. No rigidity. No Brudzinski's sign and no Kernig's sign noted.  Cardiovascular: Normal rate and regular rhythm.   Pulmonary/Chest: Effort normal and breath sounds normal.  Musculoskeletal: Normal range of motion.  Neurological: She is alert and oriented to person, place, and time. She has normal reflexes. No cranial nerve deficit or sensory deficit. She displays a negative Romberg sign. Coordination and gait normal.       5/5 and equal upper and lower extremity strength.  Nml patellar reflexes. No past pointing.  No pronator drift.  Nml gait.    Skin: Skin is warm and dry. No rash noted.  Psychiatric: She has a normal mood and affect. Her behavior is normal.    ED Course  Procedures  MDM Pt w/ h/o migraines and questionable h/o MS presents w/ 1 month of non-traumatic right-sided headache w/ associated right facial numbness, disorientation and blurred vision.  On exam, pt appears uncomfortable, afebrile, no meningeal signs or focal neuro deficits.  Received IV fluids, reglan and decadron and denies pain relief.  Allergic to toradol as well as several other medications and driving home this evening.  D/c'd home w/ short course of pain medication.  She was not happy with Dr. Marjory Lies so I recommended that  she [facility].  She is familiar w/ [facility] as well.  Return precautions discussed.       Otilio Miu, [place] 07/08/11 [phone]

## 2011-07-07 NOTE — ED Notes (Signed)
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

## 2011-07-23 NOTE — ED Provider Notes (Signed)
Medical screening examination/treatment/procedure(s) were performed by non-physician practitioner and as supervising physician I was immediately available for consultation/collaboration.  Toy Baker, MD 07/23/11 1043

## 2011-07-23 NOTE — ED Provider Notes (Signed)
Medical screening examination/treatment/procedure(s) were performed by non-physician practitioner and as supervising physician I was immediately available for consultation/collaboration.  Toy Baker, MD 07/23/11 [phone]

## 2011-08-15 LAB — HEPATIC FUNCTION PANEL
ALT: 19
Alkaline Phosphatase: 73
Indirect Bilirubin: 0.8
Total Protein: 7.6

## 2011-08-15 LAB — DIFFERENTIAL
Basophils Absolute: 0.1
Eosinophils Absolute: 0.2
Eosinophils Absolute: 0.1
Eosinophils Relative: 2
Eosinophils Relative: 1
Lymphs Abs: 1.8
Monocytes Absolute: 0.9
Monocytes Relative: 8
Neutrophils Relative %: 75

## 2011-08-15 LAB — PREGNANCY, URINE: Preg Test, Ur: NEGATIVE

## 2011-08-15 LAB — URINALYSIS, ROUTINE W REFLEX MICROSCOPIC
Bilirubin Urine: NEGATIVE
Glucose, UA: NEGATIVE
Hgb urine dipstick: NEGATIVE
Protein, ur: NEGATIVE

## 2011-08-15 LAB — BASIC METABOLIC PANEL
BUN: 9
CO2: 24
Chloride: 106
Chloride: 103
GFR calc Af Amer: >60
GFR calc non Af Amer: >60
Glucose, Bld: 105 — ABNORMAL HIGH
Potassium: 3.6
Potassium: 3.9
Sodium: 138

## 2011-08-15 LAB — CBC
HCT: 36.3
HCT: 40
Hemoglobin: 12.5
MCV: 84.9
MCV: 84.4
Platelets: 340
RBC: 4.27
RDW: 14.7
WBC: 11.5 — ABNORMAL HIGH

## 2011-08-18 LAB — URINE MICROSCOPIC-ADD ON

## 2011-08-18 LAB — COMPREHENSIVE METABOLIC PANEL
AST: 19
Alkaline Phosphatase: 81
CO2: 26
Chloride: 107
Creatinine, Ser: 0.7
GFR calc Af Amer: >60
GFR calc non Af Amer: >60
Total Bilirubin: 0.4

## 2011-08-18 LAB — URINALYSIS, ROUTINE W REFLEX MICROSCOPIC
Bilirubin Urine: NEGATIVE
Glucose, UA: NEGATIVE
Ketones, ur: NEGATIVE
Leukocytes, UA: NEGATIVE
Nitrite: NEGATIVE
Protein, ur: NEGATIVE
Specific Gravity, Urine: 1.023
Urobilinogen, UA: 0.2
pH: 5.5

## 2011-08-18 LAB — DIFFERENTIAL
Basophils Relative: 1
Lymphocytes Relative: 26
Monocytes Relative: 7
Neutro Abs: 5.5
Neutrophils Relative %: 65

## 2011-08-18 LAB — CBC
HCT: 41
MCV: 86.6
RBC: 4.74
WBC: 8.6

## 2011-08-18 LAB — OCCULT BLOOD X 1 CARD TO LAB, STOOL: Fecal Occult Bld: NEGATIVE

## 2011-08-18 LAB — D-DIMER, QUANTITATIVE: D-Dimer, Quant: <0.22

## 2011-08-18 LAB — PREGNANCY, URINE: Preg Test, Ur: NEGATIVE

## 2011-08-22 LAB — URINALYSIS, ROUTINE W REFLEX MICROSCOPIC
Glucose, UA: NEGATIVE
Ketones, ur: NEGATIVE
Protein, ur: NEGATIVE

## 2011-08-22 LAB — COMPREHENSIVE METABOLIC PANEL
Alkaline Phosphatase: 79
BUN: 10
Chloride: 104
Glucose, Bld: 84
Potassium: 3.9
Total Bilirubin: 0.6

## 2011-08-22 LAB — URINE CULTURE

## 2011-08-22 LAB — LIPASE, BLOOD: Lipase: 63

## 2011-08-22 LAB — CBC
HCT: 41.3
Hemoglobin: 13.9
WBC: 8.9

## 2011-08-22 LAB — URINE MICROSCOPIC-ADD ON

## 2011-08-22 LAB — RAPID STREP SCREEN (MED CTR MEBANE ONLY): Streptococcus, Group A Screen (Direct): NEGATIVE

## 2011-09-05 LAB — DIFFERENTIAL
Basophils Absolute: 0.1
Eosinophils Relative: 2
Lymphocytes Relative: 13
Lymphs Abs: 1.8
Monocytes Absolute: 0.5
Monocytes Relative: 4

## 2011-09-05 LAB — CBC
HCT: 39.9
Hemoglobin: 13.5
RBC: 4.74
RDW: 13.7

## 2011-09-05 LAB — BASIC METABOLIC PANEL
GFR calc non Af Amer: >60
Glucose, Bld: 114 — ABNORMAL HIGH
Potassium: 4.2
Sodium: 140

## 2011-09-05 LAB — URINALYSIS, ROUTINE W REFLEX MICROSCOPIC
Bilirubin Urine: NEGATIVE
Hgb urine dipstick: NEGATIVE
Nitrite: NEGATIVE
Specific Gravity, Urine: 1.015
pH: 7.5

## 2011-09-08 LAB — URINALYSIS, ROUTINE W REFLEX MICROSCOPIC
Ketones, ur: NEGATIVE
Protein, ur: NEGATIVE
Urobilinogen, UA: 0.2

## 2011-09-08 LAB — CBC
Hemoglobin: 12.3
MCHC: 33.2
MCHC: 34.1
Platelets: 300
RBC: 4.24
RDW: 15 — ABNORMAL HIGH
WBC: 10

## 2011-09-08 LAB — BASIC METABOLIC PANEL
CO2: 23
Calcium: 8.4
Creatinine, Ser: 0.54
GFR calc non Af Amer: >60
Glucose, Bld: 98
Sodium: 137

## 2011-09-08 LAB — DIFFERENTIAL
Basophils Absolute: 0
Basophils Relative: 0
Eosinophils Relative: 2
Lymphocytes Relative: 18
Neutro Abs: 9.5 — ABNORMAL HIGH

## 2011-09-08 LAB — COMPREHENSIVE METABOLIC PANEL
BUN: 10
CO2: 25
Chloride: 105
Creatinine, Ser: 0.6
GFR calc non Af Amer: >60
Glucose, Bld: 117 — ABNORMAL HIGH
Total Bilirubin: 0.2 — ABNORMAL LOW

## 2011-09-08 LAB — OCCULT BLOOD X 1 CARD TO LAB, STOOL: Fecal Occult Bld: NEGATIVE

## 2011-09-08 LAB — PREGNANCY, URINE: Preg Test, Ur: NEGATIVE

## 2011-09-08 LAB — LIPASE, BLOOD: Lipase: 25

## 2012-06-05 ENCOUNTER — Encounter (HOSPITAL_BASED_OUTPATIENT_CLINIC_OR_DEPARTMENT_OTHER): Payer: Self-pay

## 2012-06-05 ENCOUNTER — Emergency Department (HOSPITAL_BASED_OUTPATIENT_CLINIC_OR_DEPARTMENT_OTHER)
Admission: EM | Admit: 2012-06-05 | Discharge: 2012-06-05 | Disposition: A | Discharge status: Home / Self Care | Payer: Medicaid Other | Attending: Emergency Medicine | Admitting: Emergency Medicine

## 2012-06-05 DIAGNOSIS — F1123 Opioid dependence with withdrawal: Secondary | ICD-10-CM

## 2012-06-05 DIAGNOSIS — F1120 Opioid dependence, uncomplicated: Secondary | ICD-10-CM | POA: Insufficient documentation

## 2012-06-05 DIAGNOSIS — F411 Generalized anxiety disorder: Secondary | ICD-10-CM | POA: Insufficient documentation

## 2012-06-05 DIAGNOSIS — F19939 Other psychoactive substance use, unspecified with withdrawal, unspecified: Secondary | ICD-10-CM | POA: Insufficient documentation

## 2012-06-05 DIAGNOSIS — R42 Dizziness and giddiness: Secondary | ICD-10-CM | POA: Insufficient documentation

## 2012-06-05 DIAGNOSIS — R079 Chest pain, unspecified: Secondary | ICD-10-CM | POA: Insufficient documentation

## 2012-06-05 DIAGNOSIS — F1193 Opioid use, unspecified with withdrawal: Secondary | ICD-10-CM

## 2012-06-05 HISTORY — DX: Other psychoactive substance abuse, uncomplicated: F19.10

## 2012-06-05 MED ORDER — ONDANSETRON HCL 4 MG PO TABS: 4.0000 mg | ORAL_TABLET | Freq: Four times a day (QID) | ORAL | Status: AC

## 2012-06-05 MED ORDER — CLONIDINE HCL 0.1 MG PO TABS
0.1000 mg | ORAL_TABLET | Freq: Once | ORAL | Status: AC
  Administered 2012-06-05: 0.1 mg via ORAL
  Filled 2012-06-05: qty 1

## 2012-06-05 MED ORDER — ONDANSETRON 4 MG PO TBDP
4.0000 mg | ORAL_TABLET | Freq: Once | ORAL | Status: AC
  Administered 2012-06-05: 4 mg via ORAL
  Filled 2012-06-05: qty 1

## 2012-06-05 MED ORDER — ALPRAZOLAM 0.25 MG PO TABS: 0.2500 mg | ORAL_TABLET | Freq: Every evening | ORAL | Status: AC | PRN
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
## 2012-11-20 ENCOUNTER — Encounter (HOSPITAL_BASED_OUTPATIENT_CLINIC_OR_DEPARTMENT_OTHER): Payer: Self-pay | Admitting: *Deleted

## 2012-11-20 ENCOUNTER — Emergency Department (HOSPITAL_BASED_OUTPATIENT_CLINIC_OR_DEPARTMENT_OTHER): Payer: Medicaid Other

## 2012-11-20 ENCOUNTER — Emergency Department (HOSPITAL_BASED_OUTPATIENT_CLINIC_OR_DEPARTMENT_OTHER)
Admission: EM | Admit: 2012-11-20 | Discharge: 2012-11-20 | Disposition: A | Discharge status: Home / Self Care | Payer: Medicaid Other | Attending: Emergency Medicine | Admitting: Emergency Medicine

## 2012-11-20 DIAGNOSIS — R002 Palpitations: Secondary | ICD-10-CM | POA: Insufficient documentation

## 2012-11-20 DIAGNOSIS — R079 Chest pain, unspecified: Secondary | ICD-10-CM

## 2012-11-20 DIAGNOSIS — R0602 Shortness of breath: Secondary | ICD-10-CM | POA: Insufficient documentation

## 2012-11-20 DIAGNOSIS — G35 Multiple sclerosis: Secondary | ICD-10-CM | POA: Insufficient documentation

## 2012-11-20 DIAGNOSIS — R61 Generalized hyperhidrosis: Secondary | ICD-10-CM | POA: Insufficient documentation

## 2012-11-20 DIAGNOSIS — F17200 Nicotine dependence, unspecified, uncomplicated: Secondary | ICD-10-CM | POA: Insufficient documentation

## 2012-11-20 DIAGNOSIS — Z8679 Personal history of other diseases of the circulatory system: Secondary | ICD-10-CM | POA: Insufficient documentation

## 2012-11-20 DIAGNOSIS — M79609 Pain in unspecified limb: Secondary | ICD-10-CM | POA: Insufficient documentation

## 2012-11-20 DIAGNOSIS — F1920 Other psychoactive substance dependence, uncomplicated: Secondary | ICD-10-CM | POA: Insufficient documentation

## 2012-11-20 DIAGNOSIS — R0789 Other chest pain: Secondary | ICD-10-CM | POA: Insufficient documentation

## 2012-11-20 DIAGNOSIS — Z79899 Other long term (current) drug therapy: Secondary | ICD-10-CM | POA: Insufficient documentation

## 2012-11-20 LAB — CBC WITH DIFFERENTIAL/PLATELET
Eosinophils Absolute: 0.2 10*3/uL (ref 0.0–0.7)
Eosinophils Relative: 2 % (ref 0–5)
HCT: 42 % (ref 36.0–46.0)
Hemoglobin: 14.2 g/dL (ref 12.0–15.0)
Lymphs Abs: 3.8 10*3/uL (ref 0.7–4.0)
MCH: 30.8 pg (ref 26.0–34.0)
MCV: 91.1 fL (ref 78.0–100.0)
Monocytes Absolute: 1.1 10*3/uL — ABNORMAL HIGH (ref 0.1–1.0)
Monocytes Relative: 9 % (ref 3–12)
RBC: 4.61 MIL/uL (ref 3.87–5.11)

## 2012-11-20 LAB — BASIC METABOLIC PANEL
BUN: 9 mg/dL (ref 6–23)
CO2: 28 mEq/L (ref 19–32)
Calcium: 9.3 mg/dL (ref 8.4–10.5)
GFR calc non Af Amer: >90 mL/min (ref >90)
Glucose, Bld: 87 mg/dL (ref 70–99)

## 2012-11-20 MED ORDER — FAMOTIDINE 40 MG PO TABS: 40.0000 mg | ORAL_TABLET | Freq: Two times a day (BID) | ORAL | Status: DC

## 2012-11-20 NOTE — ED Notes (Signed)
Pt. Reports she is a smoker and is able to speak in full sentences.  No nausea or vomiting noted.

## 2012-11-20 NOTE — ED Notes (Signed)
Vital signs stable. 

## 2012-11-20 NOTE — ED Notes (Signed)
Family at bedside. 

## 2012-11-20 NOTE — ED Notes (Signed)
Pt. Up to restroom with no distress noted.  Pt. Has slow steady gait to restroom.

## 2012-11-20 NOTE — ED Notes (Signed)
Pt reports that she began having a squeezing sensation just above her left breast 20 PTA as well as SOB. Pt reports that she has recently had a death in the family and has been under a lot of stress lately.

## 2012-11-20 NOTE — ED Provider Notes (Addendum)
History     CSN: [id number]  Arrival date & time 11/20/12  1330   First MD Initiated Contact with Patient 11/20/12 1508      Chief Complaint  Patient presents with  . Chest Pain    (Consider location/radiation/quality/duration/timing/severity/associated sxs/prior treatment) HPI Comments: PT presents with left sided chest tightness that has been off and on since yesterday, lasts 1-2 minutes.  Today, had episode associated with palpitations, diaphoresis, SOB, chest pain, lasted about one minute.  Says that pain radiates to left arm.  Has hx of panic attacks in past, but says that this feels different.  No cough or chest congestion.  No fevers/chills.  No pleuritic pain.  No leg pain or swelling.  Says that her mom had heart problems at age 8, but does not know details.  Patient is a 31 y.o. female presenting with chest pain.  Chest Pain Primary symptoms include shortness of breath and palpitations. Pertinent negatives for primary symptoms include no fever, no fatigue, no cough, no abdominal pain, no nausea, no vomiting and no dizziness.  The palpitations also occurred with shortness of breath. The palpitations did not occur with dizziness.  Associated symptoms include diaphoresis.  Pertinent negatives for associated symptoms include no numbness and no weakness.     Past Medical History  Diagnosis Date  . Migraine   . MS (multiple sclerosis)   . Substance abuse     Past Surgical History  Procedure Date  . Tubal ligation   . Abdominal hysterectomy   . Appendectomy   . Salivary gland surgery   . Joint replacement     History reviewed. No pertinent family history.  History  Substance Use Topics  . Smoking status: Current Every Day Smoker -- 1.0 packs/day  . Smokeless tobacco: Not on file  . Alcohol Use: No    OB History    Grav Para Term Preterm Abortions TAB SAB Ect Mult Living                  Review of Systems  Constitutional: Positive for diaphoresis.  Negative for fever, chills and fatigue.  HENT: Negative for congestion, rhinorrhea and sneezing.   Eyes: Negative.   Respiratory: Positive for chest tightness and shortness of breath. Negative for cough.   Cardiovascular: Positive for palpitations. Negative for chest pain and leg swelling.  Gastrointestinal: Negative for nausea, vomiting, abdominal pain, diarrhea and blood in stool.  Genitourinary: Negative for frequency, hematuria, flank pain and difficulty urinating.  Musculoskeletal: Negative for back pain and arthralgias.  Skin: Negative for rash.  Neurological: Negative for dizziness, speech difficulty, weakness, numbness and headaches.    Allergies  Ketorolac tromethamine; Nitrofurantoin monohyd macro; Ultram; and Compazine  Home Medications   Current Outpatient Rx  Name  Route  Sig  Dispense  Refill  . EXCEDRIN PO   Oral   Take 2 tablets by mouth as needed. For headache         . FAMOTIDINE 40 MG PO TABS   Oral   Take 1 tablet (40 mg total) by mouth 2 (two) times daily.   60 tablet   0   . LACTULOSE 10 GM/15ML PO SOLN   Oral   Take 15 mLs by mouth daily.          Marland Kitchen LEVOTHYROXINE SODIUM 75 MCG PO TABS   Oral   Take 75 mcg by mouth daily.           . LUBIPROSTONE 8 MCG PO CAPS  Oral   Take 8 mcg by mouth 2 (two) times daily with a meal.           . METOPROLOL SUCCINATE ER 100 MG PO TB24   Oral   Take 150 mg by mouth daily. Patient took this medication last night at 10 pm.         . PRESCRIPTION MEDICATION   Oral   Take 85 mg by mouth daily. Methadone 85 mg juice.         . TOPIRAMATE 25 MG PO CPSP   Oral   Take 50 mg by mouth 2 (two) times daily.             BP 116/53  Pulse 77  Temp 98 F (36.7 C) (Oral)  Resp 16  SpO2 98%  Physical Exam  Constitutional: She is oriented to person, place, and time. She appears well-developed and well-nourished.  HENT:  Head: Normocephalic and atraumatic.  Eyes: Pupils are equal, round, and reactive  to light.  Neck: Normal range of motion. Neck supple.  Cardiovascular: Normal rate, regular rhythm and normal heart sounds.   Pulmonary/Chest: Effort normal and breath sounds normal. No respiratory distress. She has no wheezes. She has no rales. She exhibits no tenderness.  Abdominal: Soft. Bowel sounds are normal. There is no tenderness. There is no rebound and no guarding.  Musculoskeletal: Normal range of motion. She exhibits no edema.  Lymphadenopathy:    She has no cervical adenopathy.  Neurological: She is alert and oriented to person, place, and time.  Skin: Skin is warm and dry. No rash noted.  Psychiatric: She has a normal mood and affect.    ED Course  Procedures (including critical care time)  Results for orders placed during the hospital encounter of 11/20/12  CBC WITH DIFFERENTIAL      Component Value Range   WBC 12.0 (*) 4.0 - 10.5 K/uL   RBC 4.61  3.87 - 5.11 MIL/uL   Hemoglobin 14.2  12.0 - 15.0 g/dL   HCT 56.2  13.0 - 86.5 %   MCV 91.1  78.0 - 100.0 fL   MCH 30.8  26.0 - 34.0 pg   MCHC 33.8  30.0 - 36.0 g/dL   RDW 78.4  69.6 - 29.5 %   Platelets 260  150 - 400 K/uL   Neutrophils Relative 58  43 - 77 %   Neutro Abs 6.9  1.7 - 7.7 K/uL   Lymphocytes Relative 31  12 - 46 %   Lymphs Abs 3.8  0.7 - 4.0 K/uL   Monocytes Relative 9  3 - 12 %   Monocytes Absolute 1.1 (*) 0.1 - 1.0 K/uL   Eosinophils Relative 2  0 - 5 %   Eosinophils Absolute 0.2  0.0 - 0.7 K/uL   Basophils Relative 0  0 - 1 %   Basophils Absolute 0.1  0.0 - 0.1 K/uL  BASIC METABOLIC PANEL      Component Value Range   Sodium 139  135 - 145 mEq/L   Potassium 3.9  3.5 - 5.1 mEq/L   Chloride 100  96 - 112 mEq/L   CO2 28  19 - 32 mEq/L   Glucose, Bld 87  70 - 99 mg/dL   BUN 9  6 - 23 mg/dL   Creatinine, Ser 2.84  0.50 - 1.10 mg/dL   Calcium 9.3  8.4 - 13.2 mg/dL   GFR calc non Af Amer >90  >90 mL/min   GFR calc  Af Amer >90  >90 mL/min  TROPONIN I      Component Value Range   Troponin I <0.30   <0.30 ng/mL   Dg Chest 2 View  11/20/2012  *RADIOLOGY REPORT*  Clinical Data: Chest pain.  Multiple sclerosis.  CHEST - 2 VIEW  Comparison:  12/21/2010  Findings:  The heart size and mediastinal contours are within normal limits.  Both lungs are clear.  The visualized skeletal structures are unremarkable.  IMPRESSION: No active cardiopulmonary disease.   Original Report Authenticated By: Myles Rosenthal, M.D.     Date: 11/20/2012  Rate: 73  Rhythm: normal sinus rhythm  QRS Axis: normal  Intervals: normal  ST/T Wave abnormalities: normal  Conduction Disutrbances:none  Narrative Interpretation:   Old EKG Reviewed: none available     1. Chest pain       MDM  Patient without any chest pain since arrival. She's had fleeting episodes of chest pain off and on since yesterday lasting 1-2 minutes. She has no ischemic changes on EKG and troponin is negative. I have a low suspicion for acute coronary syndrome. She has nothing suggestive of pulmonary embolus. She does have some heartburn type symptoms so we'll go ahead and start her on Pepcid. Advised her she needs to followup with her doctor on Thursday which is in 2 days or return here if her symptoms worsen at all. And she is amenable to this. She does have a primary care physician that she can followup with.        Rolan Bucco, MD 11/20/12 1658  Rolan Bucco, MD 11/20/12 [phone]

## 2013-09-09 ENCOUNTER — Emergency Department (HOSPITAL_BASED_OUTPATIENT_CLINIC_OR_DEPARTMENT_OTHER)
Admission: EM | Admit: 2013-09-09 | Discharge: 2013-09-09 | Disposition: A | Discharge status: Home / Self Care | Payer: Medicaid Other | Attending: Emergency Medicine | Admitting: Emergency Medicine

## 2013-09-09 ENCOUNTER — Encounter (HOSPITAL_BASED_OUTPATIENT_CLINIC_OR_DEPARTMENT_OTHER): Payer: Self-pay | Admitting: Emergency Medicine

## 2013-09-09 DIAGNOSIS — G43909 Migraine, unspecified, not intractable, without status migrainosus: Secondary | ICD-10-CM | POA: Insufficient documentation

## 2013-09-09 DIAGNOSIS — R509 Fever, unspecified: Secondary | ICD-10-CM | POA: Insufficient documentation

## 2013-09-09 DIAGNOSIS — Z8669 Personal history of other diseases of the nervous system and sense organs: Secondary | ICD-10-CM | POA: Insufficient documentation

## 2013-09-09 DIAGNOSIS — R52 Pain, unspecified: Secondary | ICD-10-CM | POA: Insufficient documentation

## 2013-09-09 DIAGNOSIS — F17200 Nicotine dependence, unspecified, uncomplicated: Secondary | ICD-10-CM | POA: Insufficient documentation

## 2013-09-09 DIAGNOSIS — J020 Streptococcal pharyngitis: Secondary | ICD-10-CM | POA: Insufficient documentation

## 2013-09-09 DIAGNOSIS — Z79899 Other long term (current) drug therapy: Secondary | ICD-10-CM | POA: Insufficient documentation

## 2013-09-09 MED ORDER — PENICILLIN G BENZATHINE 1200000 UNIT/2ML IM SUSP
1.2000 10*6.[IU] | Freq: Once | INTRAMUSCULAR | Status: AC
  Administered 2013-09-09: 1.2 10*6.[IU] via INTRAMUSCULAR
  Filled 2013-09-09: qty 2

## 2013-09-09 NOTE — ED Notes (Addendum)
Patient with body aches, sorethroat, and fever for the past few days.  Patient states that daughter had strep last week.  Alert and oriented x 3.  Patient states that she had temperature at home 101.2 and she took tylenol. PTA

## 2013-09-09 NOTE — ED Notes (Signed)
MD at bedside. 

## 2013-09-09 NOTE — ED Provider Notes (Signed)
CSN: [id number]     Arrival date & time 09/09/13  0915 History   First MD Initiated Contact with Patient 09/09/13 [phone]     Chief Complaint  Patient presents with  . Sore Throat   (Consider location/radiation/quality/duration/timing/severity/associated sxs/prior Treatment) Patient is a 32 y.o. female presenting with pharyngitis.  Sore Throat   Pt reports 3 days of sore throat, fever, body aches. Sore throat is moderate to severe bilateral and worse with swallowing. Family member recently treated for strep.   Past Medical History  Diagnosis Date  . Migraine   . MS (multiple sclerosis)   . Substance abuse    Past Surgical History  Procedure Laterality Date  . Tubal ligation    . Abdominal hysterectomy    . Appendectomy    . Salivary gland surgery    . Joint replacement     History reviewed. No pertinent family history. History  Substance Use Topics  . Smoking status: Current Every Day Smoker -- 1.00 packs/day  . Smokeless tobacco: Not on file  . Alcohol Use: No   OB History   Grav Para Term Preterm Abortions TAB SAB Ect Mult Living                 Review of Systems All other systems reviewed and are negative except as noted in HPI.   Allergies  Ketorolac tromethamine; Nitrofurantoin monohyd macro; Ultram; and Compazine  Home Medications   Current Outpatient Rx  Name  Route  Sig  Dispense  Refill  . methadone (DOLOPHINE) 10 MG tablet   Oral   Take 80 mg by mouth 1 day or 1 dose.         . Aspirin-Acetaminophen-Caffeine (EXCEDRIN PO)   Oral   Take 2 tablets by mouth as needed. For headache         . famotidine (PEPCID) 40 MG tablet   Oral   Take 1 tablet (40 mg total) by mouth 2 (two) times daily.   60 tablet   0   . lactulose (CHRONULAC) 10 GM/15ML solution   Oral   Take 15 mLs by mouth daily.          Marland Kitchen levothyroxine (SYNTHROID, LEVOTHROID) 75 MCG tablet   Oral   Take 75 mcg by mouth daily.           Marland Kitchen lubiprostone (AMITIZA) 8 MCG  capsule   Oral   Take 8 mcg by mouth 2 (two) times daily with a meal.           . metoprolol (TOPROL-XL) 100 MG 24 hr tablet   Oral   Take 150 mg by mouth daily. Patient took this medication last night at 10 pm.         . PRESCRIPTION MEDICATION   Oral   Take 85 mg by mouth daily. Methadone 85 mg juice.         . topiramate (TOPAMAX) 25 MG capsule   Oral   Take 50 mg by mouth 2 (two) times daily.            BP 133/69  Pulse 99  Temp(Src) 98.4 F (36.9 C) (Oral)  Resp 22  SpO2 97% Physical Exam  Nursing note and vitals reviewed. Constitutional: She is oriented to person, place, and time. She appears well-developed and well-nourished.  HENT:  Head: Normocephalic and atraumatic.  Mouth/Throat: Oropharyngeal exudate present.  Bilateral tonsillar erythema and swelling  Eyes: EOM are normal. Pupils are equal, round, and reactive to light.  Neck: Normal range of motion. Neck supple.  Cardiovascular: Normal rate, normal heart sounds and intact distal pulses.   Pulmonary/Chest: Effort normal and breath sounds normal.  Abdominal: Bowel sounds are normal. She exhibits no distension. There is no tenderness.  Musculoskeletal: Normal range of motion. She exhibits no edema and no tenderness.  Neurological: She is alert and oriented to person, place, and time. She has normal strength. No cranial nerve deficit or sensory deficit.  Skin: Skin is warm and dry. No rash noted.  Psychiatric: She has a normal mood and affect.    ED Course  Procedures (including critical care time) Labs Review Labs Reviewed  RAPID STREP SCREEN - Abnormal; Notable for the following:    Streptococcus, Group A Screen (Direct) POSITIVE (*)    All other components within normal limits   Imaging Review No results found.  EKG Interpretation   None       MDM   1. Strep pharyngitis      Strep positive, IM PCN, otherwise symptomatic care at home.     Markell Sciascia B. Bernette Mayers, MD 09/09/13 1030

## 2014-12-06 ENCOUNTER — Emergency Department (HOSPITAL_BASED_OUTPATIENT_CLINIC_OR_DEPARTMENT_OTHER)
Admission: EM | Admit: 2014-12-06 | Discharge: 2014-12-06 | Disposition: A | Discharge status: Home / Self Care | Payer: BLUE CROSS/BLUE SHIELD | Attending: Emergency Medicine | Admitting: Emergency Medicine

## 2014-12-06 ENCOUNTER — Emergency Department (HOSPITAL_BASED_OUTPATIENT_CLINIC_OR_DEPARTMENT_OTHER): Payer: BLUE CROSS/BLUE SHIELD

## 2014-12-06 ENCOUNTER — Encounter (HOSPITAL_BASED_OUTPATIENT_CLINIC_OR_DEPARTMENT_OTHER): Payer: Self-pay

## 2014-12-06 DIAGNOSIS — S8992XA Unspecified injury of left lower leg, initial encounter: Secondary | ICD-10-CM | POA: Diagnosis not present

## 2014-12-06 DIAGNOSIS — Z8679 Personal history of other diseases of the circulatory system: Secondary | ICD-10-CM | POA: Insufficient documentation

## 2014-12-06 DIAGNOSIS — Z720 Tobacco use: Secondary | ICD-10-CM | POA: Insufficient documentation

## 2014-12-06 DIAGNOSIS — Y998 Other external cause status: Secondary | ICD-10-CM | POA: Diagnosis not present

## 2014-12-06 DIAGNOSIS — Y92410 Unspecified street and highway as the place of occurrence of the external cause: Secondary | ICD-10-CM | POA: Insufficient documentation

## 2014-12-06 DIAGNOSIS — Y9389 Activity, other specified: Secondary | ICD-10-CM | POA: Diagnosis not present

## 2014-12-06 DIAGNOSIS — Z8719 Personal history of other diseases of the digestive system: Secondary | ICD-10-CM | POA: Diagnosis not present

## 2014-12-06 DIAGNOSIS — Z8669 Personal history of other diseases of the nervous system and sense organs: Secondary | ICD-10-CM | POA: Diagnosis not present

## 2014-12-06 DIAGNOSIS — M79605 Pain in left leg: Secondary | ICD-10-CM

## 2014-12-06 DIAGNOSIS — R160 Hepatomegaly, not elsewhere classified: Secondary | ICD-10-CM | POA: Insufficient documentation

## 2014-12-06 HISTORY — DX: Hepatomegaly, not elsewhere classified: R16.0

## 2014-12-06 HISTORY — DX: Gastroparesis: K31.84

## 2014-12-06 MED ORDER — OXYCODONE-ACETAMINOPHEN 5-325 MG PO TABS
2.0000 | ORAL_TABLET | Freq: Once | ORAL | Status: AC
  Administered 2014-12-06: 2 via ORAL
  Filled 2014-12-06: qty 2

## 2014-12-06 MED ORDER — OXYCODONE-ACETAMINOPHEN 5-325 MG PO TABS: 1.0000 | ORAL_TABLET | ORAL | Status: DC | PRN
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
## 2014-12-26 ENCOUNTER — Other Ambulatory Visit: Payer: Self-pay | Admitting: Orthopaedic Surgery

## 2014-12-26 DIAGNOSIS — M545 Low back pain: Secondary | ICD-10-CM

## 2014-12-30 ENCOUNTER — Ambulatory Visit
Admission: RE | Admit: 2014-12-30 | Discharge: 2014-12-30 | Disposition: A | Discharge status: Home / Self Care | Payer: BLUE CROSS/BLUE SHIELD | Source: Ambulatory Visit | Attending: Orthopaedic Surgery | Admitting: Orthopaedic Surgery

## 2014-12-30 DIAGNOSIS — M545 Low back pain: Secondary | ICD-10-CM

## 2014-12-31 ENCOUNTER — Other Ambulatory Visit: Payer: Self-pay | Admitting: Orthopaedic Surgery

## 2014-12-31 ENCOUNTER — Ambulatory Visit
Admission: RE | Admit: 2014-12-31 | Discharge: 2014-12-31 | Disposition: A | Discharge status: Home / Self Care | Payer: BLUE CROSS/BLUE SHIELD | Source: Ambulatory Visit | Attending: Orthopaedic Surgery | Admitting: Orthopaedic Surgery

## 2014-12-31 DIAGNOSIS — R102 Pelvic and perineal pain: Secondary | ICD-10-CM

## 2015-01-14 ENCOUNTER — Other Ambulatory Visit: Payer: Self-pay | Admitting: Orthopaedic Surgery

## 2015-01-14 DIAGNOSIS — M542 Cervicalgia: Secondary | ICD-10-CM

## 2015-01-14 DIAGNOSIS — M5020 Other cervical disc displacement, unspecified cervical region: Secondary | ICD-10-CM

## 2015-01-16 ENCOUNTER — Other Ambulatory Visit: Payer: Self-pay | Admitting: Orthopaedic Surgery

## 2015-01-16 DIAGNOSIS — M5126 Other intervertebral disc displacement, lumbar region: Secondary | ICD-10-CM

## 2015-01-16 DIAGNOSIS — M542 Cervicalgia: Secondary | ICD-10-CM

## 2015-01-27 ENCOUNTER — Other Ambulatory Visit: Payer: BLUE CROSS/BLUE SHIELD

## 2015-02-06 ENCOUNTER — Inpatient Hospital Stay
Admission: RE | Admit: 2015-02-06 | Discharge: 2015-02-06 | Disposition: A | Discharge status: Home / Self Care | Payer: BLUE CROSS/BLUE SHIELD | Source: Ambulatory Visit | Attending: Orthopaedic Surgery | Admitting: Orthopaedic Surgery

## 2015-02-06 ENCOUNTER — Inpatient Hospital Stay: Admission: RE | Admit: 2015-02-06 | Payer: BLUE CROSS/BLUE SHIELD | Source: Ambulatory Visit

## 2015-02-06 NOTE — Discharge Instructions (Signed)

## 2015-02-19 ENCOUNTER — Other Ambulatory Visit: Payer: BLUE CROSS/BLUE SHIELD

## 2015-02-19 ENCOUNTER — Inpatient Hospital Stay: Admission: RE | Admit: 2015-02-19 | Payer: BLUE CROSS/BLUE SHIELD | Source: Ambulatory Visit

## 2015-03-03 ENCOUNTER — Ambulatory Visit
Admission: RE | Admit: 2015-03-03 | Discharge: 2015-03-03 | Disposition: A | Discharge status: Home / Self Care | Payer: BLUE CROSS/BLUE SHIELD | Source: Ambulatory Visit | Attending: Orthopaedic Surgery | Admitting: Orthopaedic Surgery

## 2015-03-03 ENCOUNTER — Other Ambulatory Visit: Payer: Self-pay | Admitting: Orthopaedic Surgery

## 2015-03-03 ENCOUNTER — Other Ambulatory Visit: Payer: BLUE CROSS/BLUE SHIELD

## 2015-03-03 DIAGNOSIS — M542 Cervicalgia: Secondary | ICD-10-CM

## 2015-03-03 DIAGNOSIS — M545 Low back pain: Secondary | ICD-10-CM

## 2015-03-03 DIAGNOSIS — M5020 Other cervical disc displacement, unspecified cervical region: Secondary | ICD-10-CM

## 2015-03-03 DIAGNOSIS — M5126 Other intervertebral disc displacement, lumbar region: Secondary | ICD-10-CM

## 2015-03-03 MED ORDER — DIAZEPAM 5 MG PO TABS
10.0000 mg | ORAL_TABLET | Freq: Once | ORAL | Status: AC
  Administered 2015-03-03: 10 mg via ORAL

## 2015-03-03 MED ORDER — MEPERIDINE HCL 100 MG/ML IJ SOLN
100.0000 mg | Freq: Once | INTRAMUSCULAR | Status: AC
Start: 2015-03-03 — End: 2015-03-03
  Administered 2015-03-03: 100 mg via INTRAMUSCULAR

## 2015-03-03 MED ORDER — IOHEXOL 180 MG/ML  SOLN
20.0000 mL | Freq: Once | INTRAMUSCULAR | Status: AC | PRN
  Administered 2015-03-03: 20 mL via INTRATHECAL

## 2015-03-03 MED ORDER — ONDANSETRON HCL 4 MG/2ML IJ SOLN
4.0000 mg | Freq: Once | INTRAMUSCULAR | Status: AC
  Administered 2015-03-03: 4 mg via INTRAMUSCULAR

## 2015-03-03 NOTE — Discharge Instructions (Signed)

## 2015-03-06 ENCOUNTER — Emergency Department (HOSPITAL_BASED_OUTPATIENT_CLINIC_OR_DEPARTMENT_OTHER)
Admission: EM | Admit: 2015-03-06 | Discharge: 2015-03-06 | Disposition: A | Discharge status: Home / Self Care | Payer: BLUE CROSS/BLUE SHIELD | Attending: Emergency Medicine | Admitting: Emergency Medicine

## 2015-03-06 ENCOUNTER — Emergency Department (HOSPITAL_BASED_OUTPATIENT_CLINIC_OR_DEPARTMENT_OTHER): Payer: BLUE CROSS/BLUE SHIELD

## 2015-03-06 ENCOUNTER — Encounter (HOSPITAL_BASED_OUTPATIENT_CLINIC_OR_DEPARTMENT_OTHER): Payer: Self-pay

## 2015-03-06 DIAGNOSIS — Z720 Tobacco use: Secondary | ICD-10-CM | POA: Diagnosis not present

## 2015-03-06 DIAGNOSIS — Z8619 Personal history of other infectious and parasitic diseases: Secondary | ICD-10-CM | POA: Insufficient documentation

## 2015-03-06 DIAGNOSIS — Z8679 Personal history of other diseases of the circulatory system: Secondary | ICD-10-CM | POA: Diagnosis not present

## 2015-03-06 DIAGNOSIS — Z8669 Personal history of other diseases of the nervous system and sense organs: Secondary | ICD-10-CM | POA: Diagnosis not present

## 2015-03-06 DIAGNOSIS — R51 Headache: Secondary | ICD-10-CM | POA: Diagnosis present

## 2015-03-06 DIAGNOSIS — J0100 Acute maxillary sinusitis, unspecified: Secondary | ICD-10-CM | POA: Diagnosis not present

## 2015-03-06 LAB — CBC WITH DIFFERENTIAL/PLATELET
Basophils Absolute: 0 10*3/uL (ref 0.0–0.1)
Basophils Relative: 0 % (ref 0–1)
Eosinophils Absolute: 0.3 10*3/uL (ref 0.0–0.7)
Eosinophils Relative: 2 % (ref 0–5)
HEMATOCRIT: 41.7 % (ref 36.0–46.0)
Hemoglobin: 14 g/dL (ref 12.0–15.0)
Lymphocytes Relative: 27 % (ref 12–46)
Lymphs Abs: 2.8 10*3/uL (ref 0.7–4.0)
MCH: 30.2 pg (ref 26.0–34.0)
MCHC: 33.6 g/dL (ref 30.0–36.0)
MCV: 89.9 fL (ref 78.0–100.0)
Monocytes Absolute: 0.6 10*3/uL (ref 0.1–1.0)
Monocytes Relative: 6 % (ref 3–12)
Neutro Abs: 6.7 10*3/uL (ref 1.7–7.7)
Neutrophils Relative %: 65 % (ref 43–77)
Platelets: 312 10*3/uL (ref 150–400)
RBC: 4.64 MIL/uL (ref 3.87–5.11)
RDW: 13.9 % (ref 11.5–15.5)
WBC: 10.4 10*3/uL (ref 4.0–10.5)

## 2015-03-06 LAB — BASIC METABOLIC PANEL
ANION GAP: 8 (ref 5–15)
BUN: 8 mg/dL (ref 6–23)
CHLORIDE: 101 mmol/L (ref 96–112)
CO2: 28 mmol/L (ref 19–32)
CREATININE: 0.73 mg/dL (ref 0.50–1.10)
Calcium: 9.1 mg/dL (ref 8.4–10.5)
GFR calc non Af Amer: >90 mL/min (ref >90)
Glucose, Bld: 143 mg/dL — ABNORMAL HIGH (ref 70–99)
Potassium: 3.9 mmol/L (ref 3.5–5.1)
SODIUM: 137 mmol/L (ref 135–145)

## 2015-03-06 LAB — URINALYSIS, ROUTINE W REFLEX MICROSCOPIC
Bilirubin Urine: NEGATIVE
GLUCOSE, UA: NEGATIVE mg/dL
Ketones, ur: NEGATIVE mg/dL
LEUKOCYTES UA: NEGATIVE
Nitrite: NEGATIVE
Protein, ur: NEGATIVE mg/dL
Specific Gravity, Urine: 1.02 (ref 1.005–1.030)
Urobilinogen, UA: 1 mg/dL (ref 0.0–1.0)
pH: 6 (ref 5.0–8.0)

## 2015-03-06 LAB — URINE MICROSCOPIC-ADD ON

## 2015-03-06 MED ORDER — ACETAMINOPHEN 325 MG PO TABS
650.0000 mg | ORAL_TABLET | Freq: Once | ORAL | Status: AC
  Administered 2015-03-06: 650 mg via ORAL
  Filled 2015-03-06: qty 2

## 2015-03-06 MED ORDER — SODIUM CHLORIDE 0.9 % IV BOLUS (SEPSIS)
1000.0000 mL | Freq: Once | INTRAVENOUS | Status: AC
  Administered 2015-03-06: 1000 mL via INTRAVENOUS

## 2015-03-06 MED ORDER — METOCLOPRAMIDE HCL 5 MG/ML IJ SOLN
10.0000 mg | Freq: Once | INTRAMUSCULAR | Status: DC
  Filled 2015-03-06: qty 2

## 2015-03-06 MED ORDER — DIPHENHYDRAMINE HCL 50 MG/ML IJ SOLN
25.0000 mg | Freq: Once | INTRAMUSCULAR | Status: DC
  Filled 2015-03-06: qty 1

## 2015-03-06 MED ORDER — AMOXICILLIN-POT CLAVULANATE 875-125 MG PO TABS: 1.0000 | ORAL_TABLET | Freq: Two times a day (BID) | ORAL | Status: DC

## 2015-03-06 MED ORDER — ONDANSETRON HCL 4 MG/2ML IJ SOLN
4.0000 mg | Freq: Once | INTRAMUSCULAR | Status: AC
  Administered 2015-03-06: 4 mg via INTRAVENOUS

## 2015-03-06 MED ORDER — MORPHINE SULFATE 4 MG/ML IJ SOLN
4.0000 mg | Freq: Once | INTRAMUSCULAR | Status: AC
  Administered 2015-03-06: 4 mg via INTRAVENOUS
  Filled 2015-03-06: qty 1

## 2015-03-06 MED ORDER — ONDANSETRON HCL 4 MG/2ML IJ SOLN
INTRAMUSCULAR | Status: AC
  Filled 2015-03-06: qty 2

## 2015-03-06 MED ORDER — LORATADINE 10 MG PO TABS: 10.0000 mg | ORAL_TABLET | Freq: Every day | ORAL | Status: DC

## 2015-03-06 NOTE — Discharge Instructions (Signed)
Take Augmentin as directed until gone. Take Claritin daily for allergies. Refer to attached documents for more information.

## 2015-03-06 NOTE — ED Notes (Signed)
Declines "migraine cocktail"  States last time she had one she had a "reaction" and didn't like it.  Asking for a Tylenol.
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
## 2015-03-06 NOTE — ED Notes (Signed)
Patient transported to CT 

## 2015-03-06 NOTE — ED Provider Notes (Signed)
CSN: [id number]     Arrival date & time 03/06/15  1206 History   First MD Initiated Contact with Patient 03/06/15 1220     Chief Complaint  Patient presents with  . Headache     (Consider location/radiation/quality/duration/timing/severity/associated sxs/prior Treatment) HPI Comments: Patient is a 34 year old female with a past medical history of substance abuse and migraine headches who presents with a headache that started last night. Symptoms started gradually and progressively worsened since the onset. The pain is pressue, constant and is located in the top and front of her head without radiation. Patient has tried nothing for symptoms without relief. No alleviating/aggravating factors. Patient reports associated nausea and photophobia and sinus pressure. She reports associated puffy face when she woke up this morning. Patient denies fever, vomiting, diarrhea, numbness/tingling, weakness, visual changes, chest pain, SOB, abdominal pain.      Past Medical History  Diagnosis Date  . Migraine   . MS (multiple sclerosis)   . Substance abuse   . Gastroparesis   . Enlarged liver    Past Surgical History  Procedure Laterality Date  . Tubal ligation    . Abdominal hysterectomy    . Appendectomy    . Salivary gland surgery    . Joint replacement     No family history on file. History  Substance Use Topics  . Smoking status: Current Every Day Smoker -- 1.00 packs/day  . Smokeless tobacco: Not on file  . Alcohol Use: No   OB History    No data available     Review of Systems  HENT: Positive for congestion.   Neurological: Positive for headaches.  All other systems reviewed and are negative.     Allergies  Toradol; Macrobid; Ultram; and Compazine  Home Medications   Prior to Admission medications   Medication Sig Start Date End Date Taking? Authorizing Provider  methadone (DOLOPHINE) 10 MG tablet Take 80 mg by mouth 1 day or 1 dose.    Historical Provider, MD   ondansetron (ZOFRAN) 4 MG tablet  11/17/14   Historical Provider, MD  oxyCODONE-acetaminophen (PERCOCET/ROXICET) 5-325 MG per tablet Take 1 tablet by mouth every 4 (four) hours as needed. 12/06/14   Kristen N Ward, DO   BP 144/65 mmHg  Pulse 84  Temp(Src) 98.2 F (36.8 C) (Oral)  Resp 18  Ht 5\' 3"  (1.6 m)  Wt 214 lb (97.07 kg)  BMI 37.92 kg/m2  SpO2 100% Physical Exam  Constitutional: She is oriented to person, place, and time. She appears well-developed and well-nourished. No distress.  HENT:  Head: Normocephalic and atraumatic.  Mouth/Throat: Oropharynx is clear and moist. No oropharyngeal exudate.  Eyes: Conjunctivae and EOM are normal. Pupils are equal, round, and reactive to light.  Neck: Normal range of motion.  Cardiovascular: Normal rate and regular rhythm.  Exam reveals no gallop and no friction rub.   No murmur heard. Pulmonary/Chest: Effort normal and breath sounds normal. She has no wheezes. She has no rales. She exhibits no tenderness.  Abdominal: Soft. She exhibits no distension. There is no tenderness. There is no rebound.  Musculoskeletal: Normal range of motion.  Neurological: She is alert and oriented to person, place, and time. No cranial nerve deficit. Coordination normal.  Extremity strength and sensation equal and intact bilaterally. Speech is goal-oriented. Moves limbs without ataxia.   Skin: Skin is warm and dry.  Psychiatric: She has a normal mood and affect. Her behavior is normal.  Nursing note and vitals reviewed.  ED Course  Procedures (including critical care time) Labs Review Labs Reviewed  BASIC METABOLIC PANEL - Abnormal; Notable for the following:    Glucose, Bld 143 (*)    All other components within normal limits  URINALYSIS, ROUTINE W REFLEX MICROSCOPIC - Abnormal; Notable for the following:    APPearance CLOUDY (*)    Hgb urine dipstick MODERATE (*)    All other components within normal limits  URINE MICROSCOPIC-ADD ON - Abnormal;  Notable for the following:    Squamous Epithelial / LPF MANY (*)    Bacteria, UA MANY (*)    All other components within normal limits  CBC WITH DIFFERENTIAL/PLATELET    Imaging Review Ct Head Wo Contrast  03/06/2015   CLINICAL DATA:  Headache starting last night, feels groggy  EXAM: CT HEAD WITHOUT CONTRAST  TECHNIQUE: Contiguous axial images were obtained from the base of the skull through the vertex without intravenous contrast.  COMPARISON:  06/20/2011  FINDINGS: No skull fracture is noted. Paranasal sinuses and mastoid air cells are unremarkable. No intracranial hemorrhage, mass effect or midline shift.  The gray and white-matter differentiation is preserved. No acute infarction. No hydrocephalus. No mass lesion is noted on this unenhanced scan.  IMPRESSION: No acute intracranial abnormality.   Electronically Signed   By: Natasha Mead M.D.   On: 03/06/2015 13:12     EKG Interpretation None      MDM   Final diagnoses:  Acute maxillary sinusitis, recurrence not specified    1:20 PM Labs and urinalysis unremarkable for acute change. Vitals stable and patient afebrile. Patient will have fluids, reglan and benadryl. No neuro deficits.   3:19 PM Labs and urinalysis unremarkable for acute changes. Vitals stable and patient afebrile. CT unremarkable for acute changes. Patient will be treated for sinusitis and allergies. Patient refused reglan and benadryl. Patient given morphine.   Emilia Beck, PA-C 03/06/15 1527  Rolland Porter, MD 03/12/15 1736

## 2015-03-06 NOTE — ED Notes (Signed)
Family at bedside. 

## 2015-06-27 ENCOUNTER — Encounter (HOSPITAL_BASED_OUTPATIENT_CLINIC_OR_DEPARTMENT_OTHER): Payer: Self-pay | Admitting: Emergency Medicine

## 2015-06-27 ENCOUNTER — Emergency Department (HOSPITAL_BASED_OUTPATIENT_CLINIC_OR_DEPARTMENT_OTHER): Payer: BLUE CROSS/BLUE SHIELD

## 2015-06-27 ENCOUNTER — Emergency Department (HOSPITAL_BASED_OUTPATIENT_CLINIC_OR_DEPARTMENT_OTHER)
Admission: EM | Admit: 2015-06-27 | Discharge: 2015-06-28 | Disposition: A | Discharge status: Home / Self Care | Payer: BLUE CROSS/BLUE SHIELD | Attending: Emergency Medicine | Admitting: Emergency Medicine

## 2015-06-27 DIAGNOSIS — Z8679 Personal history of other diseases of the circulatory system: Secondary | ICD-10-CM | POA: Insufficient documentation

## 2015-06-27 DIAGNOSIS — R1011 Right upper quadrant pain: Secondary | ICD-10-CM

## 2015-06-27 DIAGNOSIS — Z79899 Other long term (current) drug therapy: Secondary | ICD-10-CM | POA: Insufficient documentation

## 2015-06-27 DIAGNOSIS — Z720 Tobacco use: Secondary | ICD-10-CM | POA: Diagnosis not present

## 2015-06-27 DIAGNOSIS — Z8669 Personal history of other diseases of the nervous system and sense organs: Secondary | ICD-10-CM | POA: Insufficient documentation

## 2015-06-27 DIAGNOSIS — Z8719 Personal history of other diseases of the digestive system: Secondary | ICD-10-CM | POA: Diagnosis not present

## 2015-06-27 DIAGNOSIS — Z9089 Acquired absence of other organs: Secondary | ICD-10-CM | POA: Insufficient documentation

## 2015-06-27 DIAGNOSIS — Z9049 Acquired absence of other specified parts of digestive tract: Secondary | ICD-10-CM | POA: Insufficient documentation

## 2015-06-27 DIAGNOSIS — Z90710 Acquired absence of both cervix and uterus: Secondary | ICD-10-CM | POA: Insufficient documentation

## 2015-06-27 DIAGNOSIS — Z9851 Tubal ligation status: Secondary | ICD-10-CM | POA: Insufficient documentation

## 2015-06-27 DIAGNOSIS — R1084 Generalized abdominal pain: Secondary | ICD-10-CM | POA: Insufficient documentation

## 2015-06-27 LAB — COMPREHENSIVE METABOLIC PANEL
ALT: 21 U/L (ref 14–54)
ANION GAP: 10 (ref 5–15)
AST: 20 U/L (ref 15–41)
Albumin: 4.3 g/dL (ref 3.5–5.0)
Alkaline Phosphatase: 86 U/L (ref 38–126)
BILIRUBIN TOTAL: 0.2 mg/dL — AB (ref 0.3–1.2)
BUN: 11 mg/dL (ref 6–20)
CHLORIDE: 103 mmol/L (ref 101–111)
CO2: 26 mmol/L (ref 22–32)
CREATININE: 0.78 mg/dL (ref 0.44–1.00)
Calcium: 9.4 mg/dL (ref 8.9–10.3)
GLUCOSE: 112 mg/dL — AB (ref 65–99)
POTASSIUM: 3.7 mmol/L (ref 3.5–5.1)
Sodium: 139 mmol/L (ref 135–145)
Total Protein: 7.5 g/dL (ref 6.5–8.1)

## 2015-06-27 LAB — URINALYSIS, ROUTINE W REFLEX MICROSCOPIC
Bilirubin Urine: NEGATIVE
Glucose, UA: NEGATIVE mg/dL
KETONES UR: NEGATIVE mg/dL
Leukocytes, UA: NEGATIVE
Nitrite: NEGATIVE
PROTEIN: NEGATIVE mg/dL
Specific Gravity, Urine: 1.01 (ref 1.005–1.030)
UROBILINOGEN UA: 0.2 mg/dL (ref 0.0–1.0)
pH: 5.5 (ref 5.0–8.0)

## 2015-06-27 LAB — URINE MICROSCOPIC-ADD ON

## 2015-06-27 LAB — CBC WITH DIFFERENTIAL/PLATELET
BASOS PCT: 1 % (ref 0–1)
Basophils Absolute: 0.1 10*3/uL (ref 0.0–0.1)
EOS ABS: 0.7 10*3/uL (ref 0.0–0.7)
Eosinophils Relative: 5 % (ref 0–5)
HCT: 40.1 % (ref 36.0–46.0)
HEMOGLOBIN: 13.4 g/dL (ref 12.0–15.0)
LYMPHS PCT: 36 % (ref 12–46)
Lymphs Abs: 5.2 10*3/uL — ABNORMAL HIGH (ref 0.7–4.0)
MCH: 29.5 pg (ref 26.0–34.0)
MCHC: 33.4 g/dL (ref 30.0–36.0)
MCV: 88.3 fL (ref 78.0–100.0)
Monocytes Absolute: 1.1 10*3/uL — ABNORMAL HIGH (ref 0.1–1.0)
Monocytes Relative: 8 % (ref 3–12)
Neutro Abs: 7.2 10*3/uL (ref 1.7–7.7)
Neutrophils Relative %: 50 % (ref 43–77)
Platelets: 318 10*3/uL (ref 150–400)
RBC: 4.54 MIL/uL (ref 3.87–5.11)
RDW: 13.5 % (ref 11.5–15.5)
WBC: 14.3 10*3/uL — AB (ref 4.0–10.5)

## 2015-06-27 LAB — LIPASE, BLOOD: Lipase: 20 U/L — ABNORMAL LOW (ref 22–51)

## 2015-06-27 MED ORDER — IOHEXOL 300 MG/ML  SOLN
100.0000 mL | Freq: Once | INTRAMUSCULAR | Status: AC | PRN
  Administered 2015-06-27: 100 mL via INTRAVENOUS

## 2015-06-27 MED ORDER — SODIUM CHLORIDE 0.9 % IV SOLN
1000.0000 mL | Freq: Once | INTRAVENOUS | Status: AC
  Administered 2015-06-27: 1000 mL via INTRAVENOUS

## 2015-06-27 MED ORDER — ONDANSETRON HCL 4 MG/2ML IJ SOLN
4.0000 mg | Freq: Once | INTRAMUSCULAR | Status: AC
  Administered 2015-06-27: 4 mg via INTRAVENOUS
  Filled 2015-06-27: qty 2

## 2015-06-27 MED ORDER — HYDROMORPHONE HCL 1 MG/ML IJ SOLN
1.0000 mg | Freq: Once | INTRAMUSCULAR | Status: AC
  Administered 2015-06-27: 1 mg via INTRAVENOUS
  Filled 2015-06-27: qty 1

## 2015-06-27 MED ORDER — HYDROMORPHONE HCL 1 MG/ML IJ SOLN
0.5000 mg | Freq: Once | INTRAMUSCULAR | Status: AC
  Administered 2015-06-28: 0.5 mg via INTRAVENOUS
  Filled 2015-06-27: qty 1

## 2015-06-27 MED ORDER — FENTANYL CITRATE (PF) 100 MCG/2ML IJ SOLN
100.0000 ug | Freq: Once | INTRAMUSCULAR | Status: AC
  Administered 2015-06-27: 100 ug via INTRAVENOUS
  Filled 2015-06-27: qty 2

## 2015-06-27 MED ORDER — IOHEXOL 300 MG/ML  SOLN
50.0000 mL | Freq: Once | INTRAMUSCULAR | Status: AC | PRN
  Administered 2015-06-27: 50 mL via ORAL

## 2015-06-27 NOTE — ED Provider Notes (Signed)
CSN: [id number]     Arrival date & time 06/27/15  2020 History   First MD Initiated Contact with Patient 06/27/15 2054     Chief Complaint  Patient presents with  . Post-op Problem  . Abdominal Pain     (Consider location/radiation/quality/duration/timing/severity/associated sxs/prior Treatment) HPI   Blood pressure 132/66, pulse 91, temperature 98.9 F (37.2 C), temperature source Oral, resp. rate 20, height  (1.6 m), weight 196 lb (88.905 kg), SpO2 100 %.  Teresa Ochoa is a 34 y.o. female with past medical history significant for MS, substance abuse, gastroparesis and hepatomegaly complaining of severe, acute onset of right upper and right lower quadrant pain today. Patient is status post lap cholecystectomy 10 days ago at [facility]. She also reports abdominal distention.  She had been healing well with no significant abdominal pain prior to this morning. She had single episode of nonbloody, nonbilious, non-coffee ground appearing emesis this morning which she thinks was secondary to pain she denies fever, chills, diarrhea. Is been taking oxycodone 10 mg with little relief.  Past Medical History  Diagnosis Date  . Migraine   . MS (multiple sclerosis)   . Substance abuse   . Gastroparesis   . Enlarged liver    Past Surgical History  Procedure Laterality Date  . Tubal ligation    . Abdominal hysterectomy    . Appendectomy    . Salivary gland surgery    . Joint replacement    . Gallbladder surgery     History reviewed. No pertinent family history. History  Substance Use Topics  . Smoking status: Current Every Day Smoker -- 1.00 packs/day  . Smokeless tobacco: Not on file  . Alcohol Use: No   OB History    No data available     Review of Systems  10 systems reviewed and found to be negative, except as noted in the HPI.   Allergies  Toradol; Macrobid; Ultram; Benadryl; Reglan; and Compazine  Home Medications   Prior to Admission medications   Medication Sig  Start Date End Date Taking? Authorizing Provider  amoxicillin-clavulanate (AUGMENTIN) 875-125 MG per tablet Take 1 tablet by mouth every 12 (twelve) hours. 03/06/15   Kaitlyn Szekalski, PA-C  loratadine (CLARITIN) 10 MG tablet Take 1 tablet (10 mg total) by mouth daily. 03/06/15   Kaitlyn Szekalski, PA-C  methadone (DOLOPHINE) 10 MG tablet Take 80 mg by mouth 1 day or 1 dose.    Historical Provider, MD  methocarbamol (ROBAXIN) 500 MG tablet Take 2 tablets (1,000 mg total) by mouth 4 (four) times daily as needed (Pain). 06/28/15   Jakeria Caissie, PA-C  ondansetron (ZOFRAN) 4 MG tablet  11/17/14   Historical Provider, MD  oxyCODONE-acetaminophen (PERCOCET/ROXICET) 5-325 MG per tablet Take 1 tablet by mouth every 4 (four) hours as needed. 12/06/14   Kristen N Ward, DO   BP 107/42 mmHg  Pulse 78  Temp(Src) 98.9 F (37.2 C) (Oral)  Resp 14  Ht  (1.6 m)  Wt 196 lb (88.905 kg)  BMI 34.73 kg/m2  SpO2 98% Physical Exam  Constitutional: She is oriented to person, place, and time. She appears well-developed and well-nourished. No distress.  HENT:  Head: Normocephalic and atraumatic.  Mouth/Throat: Oropharynx is clear and moist.  Eyes: Conjunctivae and EOM are normal. Pupils are equal, round, and reactive to light.  Cardiovascular: Normal rate, regular rhythm and intact distal pulses.   Pulmonary/Chest: Effort normal and breath sounds normal. No stridor. No respiratory distress. She has no  wheezes. She has no rales. She exhibits no tenderness.  Abdominal: Soft. Bowel sounds are normal. She exhibits no distension and no mass. There is tenderness. There is no rebound and no guarding.  Well Healing trocar scars with no warmth, erythema, focal tenderness to palpation or discharge. Patient is diffusely tenderness to palpation in the right upper quadrant with voluntary guarding and no rebound. Normoactive bowel sounds.  Musculoskeletal: Normal range of motion. She exhibits no edema or tenderness.   Neurological: She is alert and oriented to person, place, and time.  Psychiatric: She has a normal mood and affect.  Nursing note and vitals reviewed.   ED Course  Procedures (including critical care time) Labs Review Labs Reviewed  CBC WITH DIFFERENTIAL/PLATELET - Abnormal; Notable for the following:    WBC 14.3 (*)    Lymphs Abs 5.2 (*)    Monocytes Absolute 1.1 (*)    All other components within normal limits  COMPREHENSIVE METABOLIC PANEL - Abnormal; Notable for the following:    Glucose, Bld 112 (*)    Total Bilirubin 0.2 (*)    All other components within normal limits  LIPASE, BLOOD - Abnormal; Notable for the following:    Lipase 20 (*)    All other components within normal limits  URINALYSIS, ROUTINE W REFLEX MICROSCOPIC (NOT AT [facility]) - Abnormal; Notable for the following:    Hgb urine dipstick MODERATE (*)    All other components within normal limits  URINE MICROSCOPIC-ADD ON - Abnormal; Notable for the following:    Squamous Epithelial / LPF FEW (*)    Bacteria, UA FEW (*)    All other components within normal limits    Imaging Review Ct Abdomen Pelvis W Contrast  06/28/2015   CLINICAL DATA:  Acute onset of right upper quadrant and right lower quadrant abdominal pain. Status post gallbladder surgery 1 1/2 weeks ago. Epigastric tenderness. Initial encounter.  EXAM: CT ABDOMEN AND PELVIS WITH CONTRAST  TECHNIQUE: Multidetector CT imaging of the abdomen and pelvis was performed using the standard protocol following bolus administration of intravenous contrast.  CONTRAST:  OMNIPAQUE IOHEXOL 300 MG/ML  SOLN  COMPARISON:  CT myelogram of the lumbar spine performed 03/03/2015, and CT of the abdomen and pelvis performed 06/30/2009  FINDINGS: The visualized lung bases are clear.  A 6 mm hypodensity within the right hepatic lobe is nonspecific. The patient is status post cholecystectomy. Trace residual fluid at the gallbladder fossa remains within normal limits, without  evidence of bile leak. The pancreas and adrenal glands are unremarkable.  The kidneys are unremarkable in appearance. There is no evidence of hydronephrosis. No renal or ureteral stones are seen. No perinephric stranding is appreciated.  No free fluid is identified. The small bowel is unremarkable in appearance. The stomach is within normal limits. No acute vascular abnormalities are seen.  The patient is status post appendectomy. The colon is unremarkable in appearance.  The bladder is mildly distended and grossly unremarkable. The patient is status post hysterectomy. No suspicious adnexal masses are seen. No inguinal lymphadenopathy is seen.  No acute osseous abnormalities are identified. Mild facet disease is noted at the lower lumbar spine.  IMPRESSION: 1. No acute abnormality seen within the abdomen or pelvis. 2. Trace residual fluid at the gallbladder fossa remains within normal limits. No evidence of bile leak. 3. 6 mm nonspecific hypodensity within the right hepatic lobe. This may reflect a tiny cyst.   Electronically Signed   By: Beryle Beams.D.  On: 06/28/2015 00:19     EKG Interpretation None      MDM   Final diagnoses:  RUQ abdominal pain  S/P cholecystectomy   Filed Vitals:   06/27/15 2200 06/27/15 2230 06/28/15 0020 06/28/15 0021  BP: 111/66 109/52 107/42   Pulse: 79 78    Temp:      TempSrc:      Resp: 14 16  14   Height:      Weight:      SpO2: 98% 98% 98%     Medications  fentaNYL (SUBLIMAZE) injection 100 mcg (100 mcg Intravenous Given 06/27/15 2110)  0.9 %  sodium chloride infusion (1,000 mLs Intravenous New Bag/Given 06/27/15 2117)  ondansetron (ZOFRAN) injection 4 mg (4 mg Intravenous Given 06/27/15 2110)  HYDROmorphone (DILAUDID) injection 1 mg (1 mg Intravenous Given 06/27/15 2133)  iohexol (OMNIPAQUE) 300 MG/ML solution 50 mL (50 mLs Oral Contrast Given 06/27/15 2230)  iohexol (OMNIPAQUE) 300 MG/ML solution 100 mL (100 mLs Intravenous Contrast Given 06/27/15 2357)   HYDROmorphone (DILAUDID) injection 0.5 mg (0.5 mg Intravenous Given 06/28/15 0016)    Teresa Ochoa is a pleasant 34 y.o. female presenting with acute severe onset of right upper quadrant abdominal pain this morning. Status post cholecystectomy by about 10 dayshe had single episode of emesis which she attributes to severe pain. Patient has been taking 10 mg oxycodone at home with little relief. Blood work is reassuring with no elevation in LFTs or bilirubin. Her white blood cell count is mildly elevated at 14.3 however she is afebrile with normal vital signs. CT with no acute abnormalities. She does have blood in her urine however it there is no signs of infection and CT did not reveal a stone. Discussed case with attending physician who agrees the patient is stable for discharge to home. Advised patient to follow closely with her surgeon.   Evaluation does not show pathology that would require ongoing emergent intervention or inpatient treatment. Pt is hemodynamically stable and mentating appropriately. Discussed findings and plan with patient/guardian, who agrees with care plan. All questions answered. Return precautions discussed and outpatient follow up given.   New Prescriptions   METHOCARBAMOL (ROBAXIN) 500 MG TABLET    Take 2 tablets (1,000 mg total) by mouth 4 (four) times daily as needed (Pain).         Wynetta Emery, PA-C 06/28/15 1221  Geoffery Lyons, MD 06/28/15 2217

## 2015-06-27 NOTE — ED Notes (Signed)
Pt in c/o RUQ and RLQ pain onset this am. Pt is post-op from gallbladder surgery 1.5 weeks ago. States her stomach feels swollen and is tender. Incision sites look intact without swelling, erythema, or drainage.

## 2015-06-28 MED ORDER — METHOCARBAMOL 500 MG PO TABS: 1000.0000 mg | ORAL_TABLET | Freq: Four times a day (QID) | ORAL | Status: DC | PRN

## 2015-06-28 NOTE — Discharge Instructions (Signed)
Please follow with your surgeon for a postop visit as soon as possible.  Do not hesitate to return to the emergency room for any new, worsening or concerning symptoms.  You can take methocarbamol (Robaxin) as needed for pain control. Please do not drive when you take this medication because it will make you drowsy.

## 2015-08-05 ENCOUNTER — Other Ambulatory Visit (HOSPITAL_COMMUNITY): Payer: Self-pay | Admitting: Specialist

## 2015-08-05 DIAGNOSIS — R102 Pelvic and perineal pain: Secondary | ICD-10-CM

## 2015-08-05 DIAGNOSIS — M5440 Lumbago with sciatica, unspecified side: Secondary | ICD-10-CM

## 2015-08-07 ENCOUNTER — Encounter (HOSPITAL_COMMUNITY)
Admission: RE | Admit: 2015-08-07 | Discharge: 2015-08-07 | Disposition: A | Discharge status: Home / Self Care | Payer: BLUE CROSS/BLUE SHIELD | Source: Ambulatory Visit | Attending: Specialist | Admitting: Specialist

## 2015-08-07 DIAGNOSIS — M545 Low back pain: Secondary | ICD-10-CM | POA: Insufficient documentation

## 2015-08-07 DIAGNOSIS — R102 Pelvic and perineal pain: Secondary | ICD-10-CM

## 2015-08-07 DIAGNOSIS — M5440 Lumbago with sciatica, unspecified side: Secondary | ICD-10-CM

## 2015-08-07 DIAGNOSIS — R1032 Left lower quadrant pain: Secondary | ICD-10-CM | POA: Insufficient documentation

## 2015-08-07 MED ORDER — TECHNETIUM TC 99M MEDRONATE IV KIT
25.3000 | PACK | Freq: Once | INTRAVENOUS | Status: AC | PRN
  Administered 2015-08-07: 25.3 via INTRAVENOUS

## 2015-08-24 ENCOUNTER — Ambulatory Visit (INDEPENDENT_AMBULATORY_CARE_PROVIDER_SITE_OTHER): Payer: Self-pay | Admitting: Neurology

## 2015-08-24 ENCOUNTER — Ambulatory Visit (INDEPENDENT_AMBULATORY_CARE_PROVIDER_SITE_OTHER): Payer: BLUE CROSS/BLUE SHIELD | Admitting: Neurology

## 2015-08-24 DIAGNOSIS — M545 Low back pain, unspecified: Secondary | ICD-10-CM

## 2015-08-24 DIAGNOSIS — M79605 Pain in left leg: Principal | ICD-10-CM

## 2015-08-24 DIAGNOSIS — M5442 Lumbago with sciatica, left side: Secondary | ICD-10-CM

## 2015-08-24 DIAGNOSIS — Z0289 Encounter for other administrative examinations: Secondary | ICD-10-CM

## 2015-08-24 NOTE — Procedures (Signed)
   NCS (NERVE CONDUCTION STUDY) WITH EMG (ELECTROMYOGRAPHY) REPORT   STUDY DATE: August 24 2015 PATIENT NAME: Teresa Ochoa DOB: [birth year] MRN: [id number]    TECHNOLOGIST: Gearldine Shown ELECTROMYOGRAPHER: Levert Feinstein M.D.  CLINICAL INFORMATION:  34 years old female, with history of left-sided low back pain, radiating pain to left lower extremity  FINDINGS: NERVE CONDUCTION STUDY: Bilateral peroneal sensory responses were normal. Bilateral peroneal to EDB and tibial motor responses were normal. The lateral tibial H reflexes were normal and symmetric.  NEEDLE ELECTROMYOGRAPHY: Selected needle examination was performed at left lower extremity muscles and left lumbosacral paraspinal muscles.  Left tibialis anterior, tibialis posterior, medial gastrocnemius, vastus lateralis, peroneal longus, biceps femoris long head was normal  There was no spontaneous activity at left lumbosacral paraspinal muscles, left L4-5 S1.  IMPRESSION:  This is a normal study. There was no electrodiagnostic evidence of left lower extremity neuropathy or left lumbar sacral radiculopathy.   INTERPRETING PHYSICIAN:   Levert Feinstein M.D. Ph.D. [facility] [address], [address] [place], [place] [address] [phone]

## 2015-08-24 NOTE — Progress Notes (Signed)
Electrodiagnostic studies normal, there was no evidence of large fiber peripheral neuropathy or left lumbosacral radiculopathy.

## 2015-12-10 ENCOUNTER — Encounter
Payer: BLUE CROSS/BLUE SHIELD | Attending: Physical Medicine & Rehabilitation | Admitting: Physical Medicine & Rehabilitation

## 2015-12-10 ENCOUNTER — Encounter: Payer: Self-pay | Admitting: Physical Medicine & Rehabilitation

## 2015-12-10 VITALS — BP 144/74 | HR 96 | Resp 16

## 2015-12-10 DIAGNOSIS — G8929 Other chronic pain: Secondary | ICD-10-CM | POA: Insufficient documentation

## 2015-12-10 DIAGNOSIS — M791 Myalgia: Secondary | ICD-10-CM

## 2015-12-10 DIAGNOSIS — M545 Low back pain: Secondary | ICD-10-CM | POA: Diagnosis not present

## 2015-12-10 DIAGNOSIS — IMO0001 Reserved for inherently not codable concepts without codable children: Secondary | ICD-10-CM

## 2015-12-10 DIAGNOSIS — G43909 Migraine, unspecified, not intractable, without status migrainosus: Secondary | ICD-10-CM | POA: Diagnosis not present

## 2015-12-10 DIAGNOSIS — G4700 Insomnia, unspecified: Secondary | ICD-10-CM | POA: Insufficient documentation

## 2015-12-10 DIAGNOSIS — M4806 Spinal stenosis, lumbar region: Secondary | ICD-10-CM | POA: Diagnosis not present

## 2015-12-10 DIAGNOSIS — G894 Chronic pain syndrome: Secondary | ICD-10-CM

## 2015-12-10 DIAGNOSIS — M47896 Other spondylosis, lumbar region: Secondary | ICD-10-CM | POA: Insufficient documentation

## 2015-12-10 DIAGNOSIS — K3184 Gastroparesis: Secondary | ICD-10-CM | POA: Insufficient documentation

## 2015-12-10 DIAGNOSIS — G35 Multiple sclerosis: Secondary | ICD-10-CM | POA: Insufficient documentation

## 2015-12-10 DIAGNOSIS — F329 Major depressive disorder, single episode, unspecified: Secondary | ICD-10-CM | POA: Insufficient documentation

## 2015-12-10 DIAGNOSIS — M792 Neuralgia and neuritis, unspecified: Secondary | ICD-10-CM

## 2015-12-10 DIAGNOSIS — M47819 Spondylosis without myelopathy or radiculopathy, site unspecified: Secondary | ICD-10-CM

## 2015-12-10 DIAGNOSIS — F17210 Nicotine dependence, cigarettes, uncomplicated: Secondary | ICD-10-CM | POA: Diagnosis not present

## 2015-12-10 DIAGNOSIS — M549 Dorsalgia, unspecified: Secondary | ICD-10-CM | POA: Diagnosis present

## 2015-12-10 DIAGNOSIS — Z79899 Other long term (current) drug therapy: Secondary | ICD-10-CM

## 2015-12-10 DIAGNOSIS — M79605 Pain in left leg: Secondary | ICD-10-CM | POA: Insufficient documentation

## 2015-12-10 DIAGNOSIS — R160 Hepatomegaly, not elsewhere classified: Secondary | ICD-10-CM | POA: Diagnosis not present

## 2015-12-10 DIAGNOSIS — E669 Obesity, unspecified: Secondary | ICD-10-CM | POA: Diagnosis not present

## 2015-12-10 DIAGNOSIS — M609 Myositis, unspecified: Secondary | ICD-10-CM

## 2015-12-10 DIAGNOSIS — M461 Sacroiliitis, not elsewhere classified: Secondary | ICD-10-CM

## 2015-12-10 DIAGNOSIS — Z5181 Encounter for therapeutic drug level monitoring: Secondary | ICD-10-CM

## 2015-12-10 DIAGNOSIS — R269 Unspecified abnormalities of gait and mobility: Secondary | ICD-10-CM | POA: Diagnosis not present

## 2015-12-10 MED ORDER — DULOXETINE HCL 30 MG PO CPEP: 30.0000 mg | ORAL_CAPSULE | Freq: Every day | ORAL | Status: DC

## 2015-12-10 MED ORDER — TRAMADOL HCL 50 MG PO TABS: 100.0000 mg | ORAL_TABLET | Freq: Four times a day (QID) | ORAL | Status: DC | PRN

## 2015-12-10 MED ORDER — AMITRIPTYLINE HCL 50 MG PO TABS: 50.0000 mg | ORAL_TABLET | Freq: Every day | ORAL | Status: DC

## 2015-12-10 NOTE — Addendum Note (Signed)
Addended by: Angela Nevin D on: 12/10/2015 01:26 PM   Modules accepted: Orders
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
## 2015-12-15 ENCOUNTER — Telehealth: Payer: Self-pay | Admitting: *Deleted

## 2015-12-15 NOTE — Telephone Encounter (Signed)
I spokw with Dr Allena Katz and he has agreed to release the RX for tramadol after Dr Barbaraann Faster Rx cancelled.  I will call [facility] and [facility] and Ms Harle Battiest.

## 2015-12-15 NOTE — Telephone Encounter (Signed)
Debbie, pharmacist from [facility] called to alert [place] to the fact that Ms Teresa Ochoa had received 90 tramadol from Dr  Otelia Sergeant on 11/29/15  and has additional refills at another pharmacy [facility]) . She was presenting the rx from Dr Allena Katz for the #360 tramadol. She was calling to see if Dr Allena Katz was aware of this and should they fill his prescription, cancel the other refills or hold Dr Eliane Decree RX, etc. Please advise

## 2015-12-15 NOTE — Telephone Encounter (Signed)
Per Dr Allena Katz, I was instructed to call [facility] and tell pharmacist to hold and not fill his prescription for the tramadol.  [facility] was notified.

## 2015-12-15 NOTE — Telephone Encounter (Signed)
Teresa Ochoa has called about her prescription being held up on her tramadol.  I explained to her the reasoning (the Rx from [place] with refills available.)  She says that she told Dr Allena Katz that she had been getting them from [place] but she was taking up to six per day which will make her be out of the #90 tomorrow.  She has agreed that we can call and cancel the refills on the [place] [facility] at [place] in [place] and would like to get the [place] Rx filled.  Dr Allena Katz--- are you ok with releasing the Rx if I cancel the other fills through [facility]?

## 2015-12-16 LAB — TOXASSURE SELECT,+ANTIDEPR,UR: PDF: 0

## 2015-12-18 NOTE — Progress Notes (Signed)
Urine drug screen for this encounter is inconsistent. Prescribed medication tramadol is present but there is also the presence of amphetamine which is not prescribed according to [facility].

## 2015-12-21 ENCOUNTER — Ambulatory Visit: Payer: BLUE CROSS/BLUE SHIELD

## 2015-12-25 ENCOUNTER — Ambulatory Visit: Payer: BLUE CROSS/BLUE SHIELD | Attending: Physical Medicine & Rehabilitation | Admitting: Physical Therapy

## 2015-12-25 ENCOUNTER — Encounter: Payer: Self-pay | Admitting: Physical Therapy

## 2015-12-25 DIAGNOSIS — M62830 Muscle spasm of back: Secondary | ICD-10-CM | POA: Diagnosis present

## 2015-12-25 DIAGNOSIS — R293 Abnormal posture: Secondary | ICD-10-CM | POA: Diagnosis present

## 2015-12-25 DIAGNOSIS — M5386 Other specified dorsopathies, lumbar region: Secondary | ICD-10-CM

## 2015-12-25 DIAGNOSIS — M2560 Stiffness of unspecified joint, not elsewhere classified: Secondary | ICD-10-CM | POA: Insufficient documentation

## 2015-12-25 DIAGNOSIS — M5442 Lumbago with sciatica, left side: Secondary | ICD-10-CM | POA: Insufficient documentation
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
## 2015-12-25 NOTE — Patient Instructions (Signed)
TENS stands for Transcutaneous Electrical Nerve Stimulation. In other words, electrical impulses are allowed to pass through the skin in order to excite a nerve.   Purpose and Use of TENS:  TENS is a method used to manage acute and chronic pain without the use of drugs. It has been effective in managing pain associated with surgery, sprains, strains, trauma, rheumatoid arthritis, and neuralgias. It is a non-addictive, low risk, and non-invasive technique used to control pain. It is not, by any means, a curative form of treatment.   How TENS Works:  Most TENS units are a Statistician unit powered by one 9 volt battery. Attached to the outside of the unit are two lead wires where two pins and/or snaps connect on each wire. All units come with a set of four reusable pads or electrodes. These are placed on the skin surrounding the area involved. By inserting the leads into  the pads, the electricity can pass from the unit making the circuit complete.  As the intensity is turned up slowly, the electrical current enters the body from the electrodes through the skin to the surrounding nerve fibers. This triggers the release of hormones from within the body. These hormones contain pain relievers. By increasing the circulation of these hormones, the person's pain may be lessened. It is also believed that the electrical stimulation itself helps to block the pain messages being sent to the brain, thus also decreasing the body's perception of pain.   Hazards:  TENS units are NOT to be used by patients with PACEMAKERS, DEFIBRILLATORS, DIABETIC PUMPS, PREGNANT WOMEN, and patients with SEIZURE DISORDERS.  TENS units are NOT to be used over the heart, throat, brain, or spinal cord.  One of the major side effects from the TENS unit may be skin irritation. Some people may develop a rash if they are sensitive to the materials used in the electrodes or the connecting wires.   Wear the unit for no more than 20-30  minutes at a time.   Avoid overuse due the body getting used to the stem making it not as effective over time.

## 2016-01-18 IMAGING — CT CT ABD-PELV W/ CM
2 of 5 series · 16 of 46 positions shown, 18 images · IV contrast (APPLIED)
Comparison: CT myelogram of the lumbar spine performed 03/03/2015,
and CT of the abdomen and pelvis performed 06/30/2009

CLINICAL DATA: Acute onset of right upper quadrant and right lower
quadrant abdominal pain. Status post gallbladder surgery [DATE] weeks
ago. Epigastric tenderness. Initial encounter.

EXAM:
CT ABDOMEN AND PELVIS WITH CONTRAST
TECHNIQUE: Multidetector CT imaging of the abdomen and pelvis was performed
using the standard protocol following bolus administration of
intravenous contrast.
CONTRAST:  100mL OMNIPAQUE IOHEXOL 300 MG/ML  SOLN

[Series 2: abd/pelvis 5.0 b31f · axial · 0.75mm/px · z∈[-306,+84]mm · 13 of 88 slices shown, 15 images]
[im 5/88  soft-tissue]
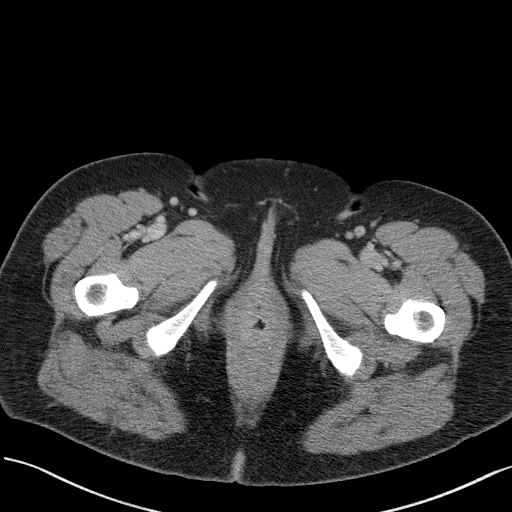
[im 5/88  bone]
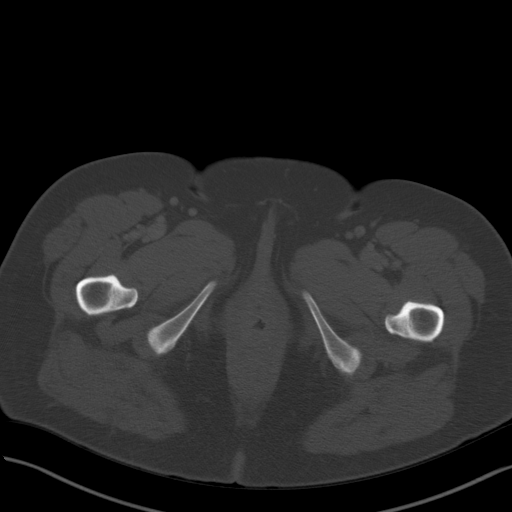
[im 10/88  soft-tissue]
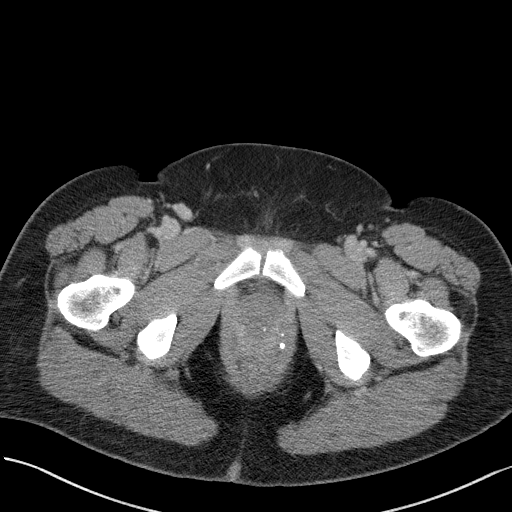
[im 20/88  soft-tissue]
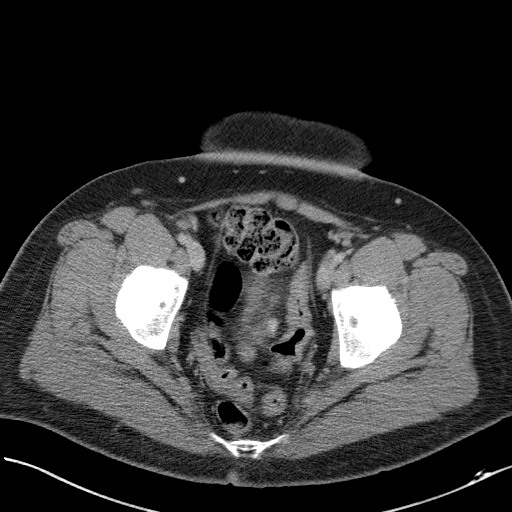
[im 25/88  soft-tissue]
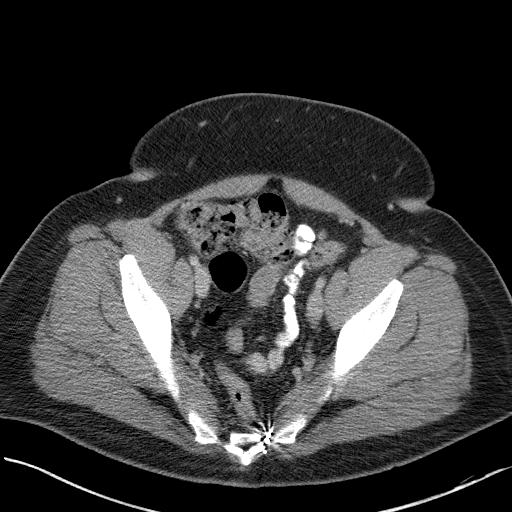
[im 30/88  soft-tissue]
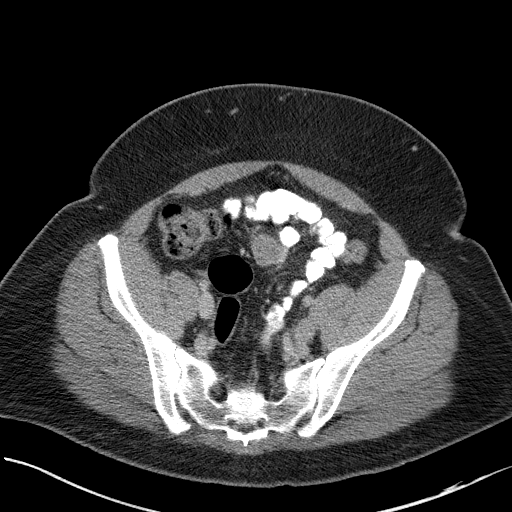
[im 39/88  soft-tissue]
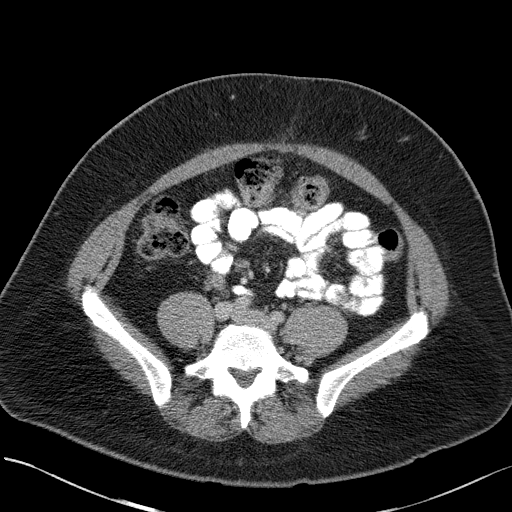
[im 44/88  soft-tissue]
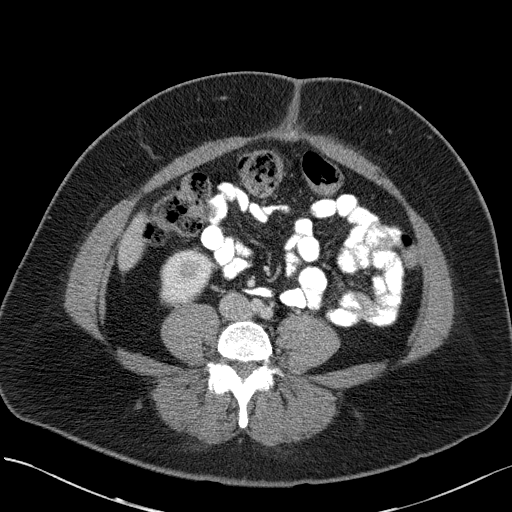
[im 49/88  soft-tissue]
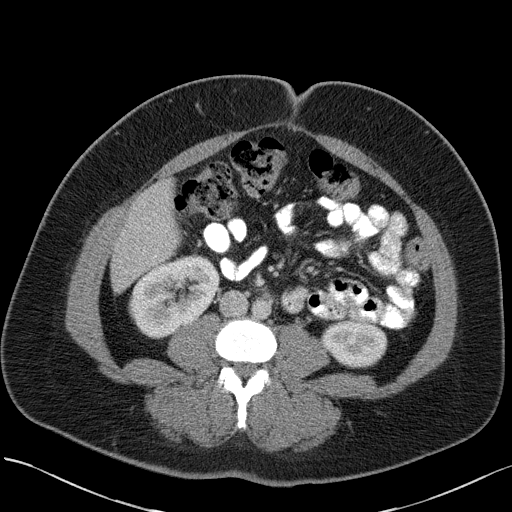
[im 59/88  soft-tissue]
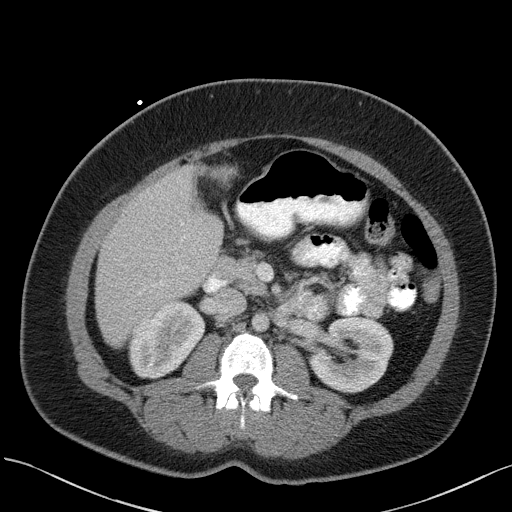
[im 59/88  bone]
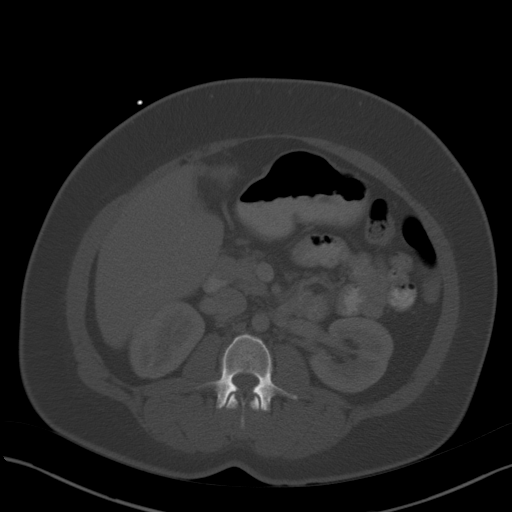
[im 63/88  soft-tissue]
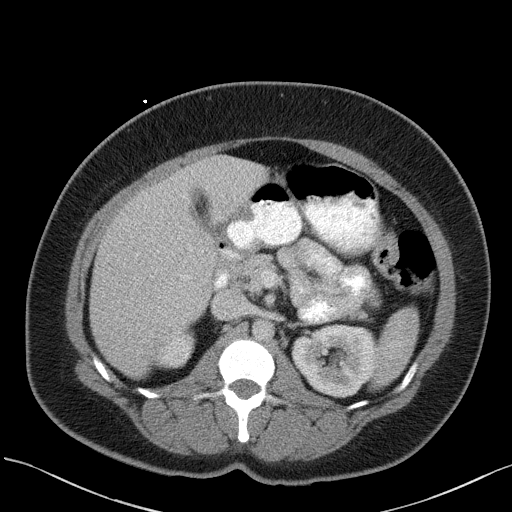
[im 68/88  soft-tissue]
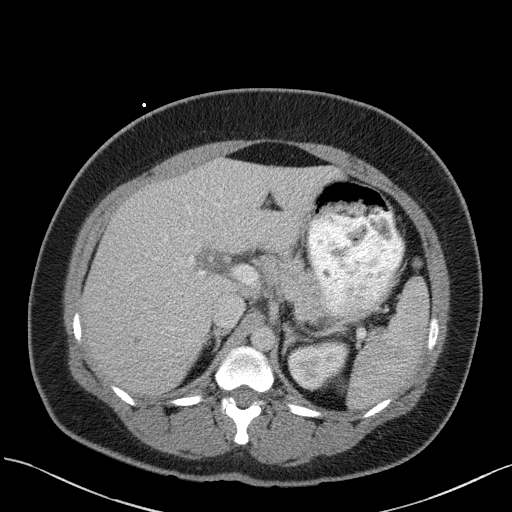
[im 78/88  soft-tissue]
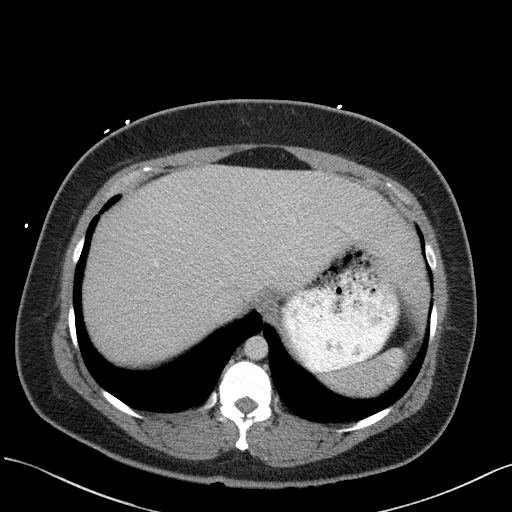
[im 83/88  soft-tissue]
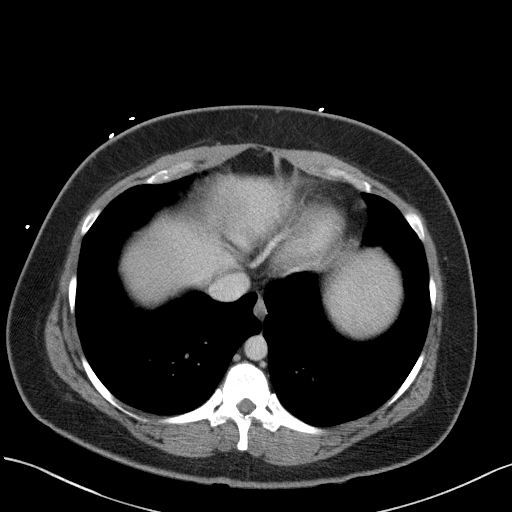

[Series 5: abd/pelvis 3.0 coronal · coronal · 0.83mm/px · 3 of 101 slices shown]
[im 34/101  soft-tissue]
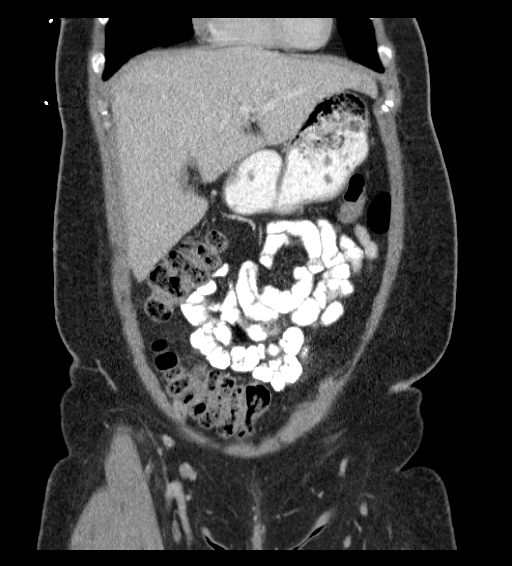
[im 45/101  soft-tissue]
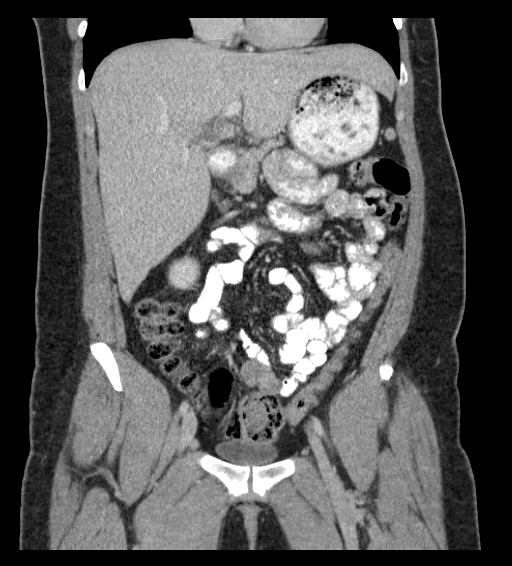
[im 56/101  soft-tissue]
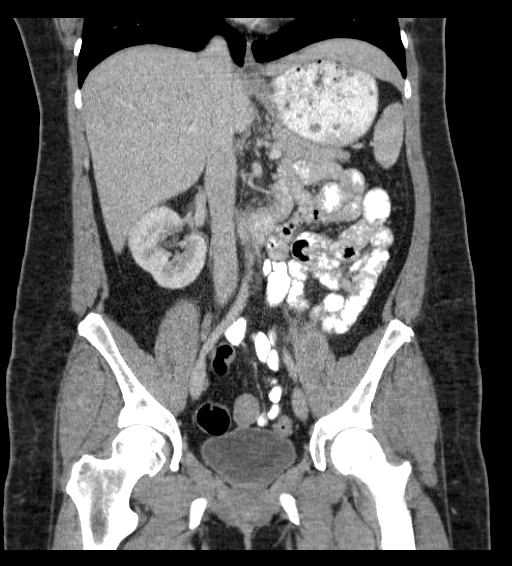

[16 of 46 positions shown; findings below may reference images not displayed]

FINDINGS: The visualized lung bases are clear.

A 6 mm hypodensity within the right hepatic lobe is nonspecific. The
patient is status post cholecystectomy. Trace residual fluid at the
gallbladder fossa remains within normal limits, without evidence of
bile leak. The pancreas and adrenal glands are unremarkable.

The kidneys are unremarkable in appearance. There is no evidence of
hydronephrosis. No renal or ureteral stones are seen. No perinephric
stranding is appreciated.

No free fluid is identified. The small bowel is unremarkable in
appearance. The stomach is within normal limits. No acute vascular
abnormalities are seen.

The patient is status post appendectomy. The colon is unremarkable
in appearance.

The bladder is mildly distended and grossly unremarkable. The
patient is status post hysterectomy. No suspicious adnexal masses
are seen. No inguinal lymphadenopathy is seen.

No acute osseous abnormalities are identified. Mild facet disease is
noted at the lower lumbar spine.
IMPRESSION: 1. No acute abnormality seen within the abdomen or pelvis.
2. Trace residual fluid at the gallbladder fossa remains within
normal limits. No evidence of bile leak.
3. 6 mm nonspecific hypodensity within the right hepatic lobe. This
may reflect a tiny cyst.

## 2016-01-21 ENCOUNTER — Encounter: Payer: Self-pay | Admitting: Physical Medicine & Rehabilitation

## 2016-01-21 ENCOUNTER — Encounter
Payer: BLUE CROSS/BLUE SHIELD | Attending: Physical Medicine & Rehabilitation | Admitting: Physical Medicine & Rehabilitation

## 2016-01-21 VITALS — BP 148/70 | HR 96 | Resp 14

## 2016-01-21 DIAGNOSIS — R160 Hepatomegaly, not elsewhere classified: Secondary | ICD-10-CM | POA: Diagnosis not present

## 2016-01-21 DIAGNOSIS — M79605 Pain in left leg: Secondary | ICD-10-CM | POA: Insufficient documentation

## 2016-01-21 DIAGNOSIS — G35 Multiple sclerosis: Secondary | ICD-10-CM | POA: Insufficient documentation

## 2016-01-21 DIAGNOSIS — R269 Unspecified abnormalities of gait and mobility: Secondary | ICD-10-CM | POA: Diagnosis not present

## 2016-01-21 DIAGNOSIS — G894 Chronic pain syndrome: Secondary | ICD-10-CM

## 2016-01-21 DIAGNOSIS — E669 Obesity, unspecified: Secondary | ICD-10-CM | POA: Diagnosis not present

## 2016-01-21 DIAGNOSIS — G8929 Other chronic pain: Secondary | ICD-10-CM | POA: Diagnosis not present

## 2016-01-21 DIAGNOSIS — IMO0001 Reserved for inherently not codable concepts without codable children: Secondary | ICD-10-CM

## 2016-01-21 DIAGNOSIS — G43109 Migraine with aura, not intractable, without status migrainosus: Secondary | ICD-10-CM

## 2016-01-21 DIAGNOSIS — M4806 Spinal stenosis, lumbar region: Secondary | ICD-10-CM | POA: Diagnosis not present

## 2016-01-21 DIAGNOSIS — F329 Major depressive disorder, single episode, unspecified: Secondary | ICD-10-CM | POA: Diagnosis not present

## 2016-01-21 DIAGNOSIS — G43909 Migraine, unspecified, not intractable, without status migrainosus: Secondary | ICD-10-CM | POA: Diagnosis not present

## 2016-01-21 DIAGNOSIS — M791 Myalgia: Secondary | ICD-10-CM

## 2016-01-21 DIAGNOSIS — K3184 Gastroparesis: Secondary | ICD-10-CM | POA: Insufficient documentation

## 2016-01-21 DIAGNOSIS — F17210 Nicotine dependence, cigarettes, uncomplicated: Secondary | ICD-10-CM | POA: Insufficient documentation

## 2016-01-21 DIAGNOSIS — M609 Myositis, unspecified: Secondary | ICD-10-CM

## 2016-01-21 DIAGNOSIS — M47896 Other spondylosis, lumbar region: Secondary | ICD-10-CM | POA: Diagnosis not present

## 2016-01-21 DIAGNOSIS — G4700 Insomnia, unspecified: Secondary | ICD-10-CM | POA: Diagnosis not present

## 2016-01-21 DIAGNOSIS — M545 Low back pain: Secondary | ICD-10-CM | POA: Diagnosis not present

## 2016-01-21 DIAGNOSIS — M549 Dorsalgia, unspecified: Secondary | ICD-10-CM | POA: Diagnosis present

## 2016-01-21 HISTORY — DX: Migraine, unspecified, not intractable, without status migrainosus: G43.909

## 2016-01-21 MED ORDER — GABAPENTIN 400 MG PO CAPS: 400.0000 mg | ORAL_CAPSULE | Freq: Three times a day (TID) | ORAL | Status: DC

## 2016-01-21 MED ORDER — DICLOFENAC SODIUM 1 % TD GEL: 2.0000 g | Freq: Four times a day (QID) | TRANSDERMAL | Status: DC

## 2016-01-21 MED ORDER — TRAMADOL HCL 50 MG PO TABS: 50.0000 mg | ORAL_TABLET | Freq: Four times a day (QID) | ORAL | Status: DC | PRN

## 2016-01-21 MED ORDER — METHOCARBAMOL 500 MG PO TABS: 500.0000 mg | ORAL_TABLET | Freq: Three times a day (TID) | ORAL | Status: DC | PRN

## 2016-01-21 MED ORDER — TRAMADOL HCL 50 MG PO TABS: 100.0000 mg | ORAL_TABLET | Freq: Four times a day (QID) | ORAL | Status: DC | PRN
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
## 2016-01-21 NOTE — Addendum Note (Signed)
Addended by: Maryla Morrow A on: 01/21/2016 02:06 PM   Modules accepted: Orders

## 2016-03-03 ENCOUNTER — Encounter
Payer: BLUE CROSS/BLUE SHIELD | Attending: Physical Medicine & Rehabilitation | Admitting: Physical Medicine & Rehabilitation

## 2016-03-03 ENCOUNTER — Encounter: Payer: Self-pay | Admitting: Physical Medicine & Rehabilitation

## 2016-03-03 VITALS — BP 160/61 | HR 98 | Resp 14

## 2016-03-03 DIAGNOSIS — R269 Unspecified abnormalities of gait and mobility: Secondary | ICD-10-CM | POA: Diagnosis not present

## 2016-03-03 DIAGNOSIS — G8929 Other chronic pain: Secondary | ICD-10-CM | POA: Insufficient documentation

## 2016-03-03 DIAGNOSIS — G4700 Insomnia, unspecified: Secondary | ICD-10-CM | POA: Insufficient documentation

## 2016-03-03 DIAGNOSIS — M4806 Spinal stenosis, lumbar region: Secondary | ICD-10-CM | POA: Diagnosis not present

## 2016-03-03 DIAGNOSIS — G35 Multiple sclerosis: Secondary | ICD-10-CM | POA: Insufficient documentation

## 2016-03-03 DIAGNOSIS — M79605 Pain in left leg: Secondary | ICD-10-CM | POA: Diagnosis not present

## 2016-03-03 DIAGNOSIS — F329 Major depressive disorder, single episode, unspecified: Secondary | ICD-10-CM | POA: Diagnosis not present

## 2016-03-03 DIAGNOSIS — M47896 Other spondylosis, lumbar region: Secondary | ICD-10-CM | POA: Insufficient documentation

## 2016-03-03 DIAGNOSIS — M545 Low back pain: Secondary | ICD-10-CM | POA: Diagnosis not present

## 2016-03-03 DIAGNOSIS — R160 Hepatomegaly, not elsewhere classified: Secondary | ICD-10-CM | POA: Diagnosis not present

## 2016-03-03 DIAGNOSIS — K3184 Gastroparesis: Secondary | ICD-10-CM | POA: Diagnosis not present

## 2016-03-03 DIAGNOSIS — M791 Myalgia, unspecified site: Secondary | ICD-10-CM

## 2016-03-03 DIAGNOSIS — G43909 Migraine, unspecified, not intractable, without status migrainosus: Secondary | ICD-10-CM | POA: Diagnosis not present

## 2016-03-03 DIAGNOSIS — F17210 Nicotine dependence, cigarettes, uncomplicated: Secondary | ICD-10-CM | POA: Diagnosis not present

## 2016-03-03 DIAGNOSIS — E669 Obesity, unspecified: Secondary | ICD-10-CM | POA: Insufficient documentation

## 2016-03-03 DIAGNOSIS — M549 Dorsalgia, unspecified: Secondary | ICD-10-CM | POA: Diagnosis present

## 2016-03-03 MED ORDER — DICLOFENAC SODIUM 1 % TD GEL: 2.0000 g | Freq: Four times a day (QID) | TRANSDERMAL | Status: DC

## 2016-03-03 MED ORDER — TRAMADOL HCL 50 MG PO TABS: 100.0000 mg | ORAL_TABLET | Freq: Four times a day (QID) | ORAL | Status: DC | PRN
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
## 2016-03-29 ENCOUNTER — Ambulatory Visit (HOSPITAL_COMMUNITY)
Admission: RE | Admit: 2016-03-29 | Discharge: 2016-03-29 | Disposition: A | Discharge status: Home / Self Care | Payer: BLUE CROSS/BLUE SHIELD | Source: Ambulatory Visit | Attending: Physical Medicine & Rehabilitation | Admitting: Physical Medicine & Rehabilitation

## 2016-03-29 DIAGNOSIS — M79605 Pain in left leg: Secondary | ICD-10-CM | POA: Insufficient documentation

## 2016-03-31 ENCOUNTER — Encounter
Payer: BLUE CROSS/BLUE SHIELD | Attending: Physical Medicine & Rehabilitation | Admitting: Physical Medicine & Rehabilitation

## 2016-03-31 ENCOUNTER — Encounter: Payer: Self-pay | Admitting: Physical Medicine & Rehabilitation

## 2016-03-31 VITALS — BP 149/82 | HR 90 | Resp 15

## 2016-03-31 DIAGNOSIS — Z5181 Encounter for therapeutic drug level monitoring: Secondary | ICD-10-CM

## 2016-03-31 DIAGNOSIS — E669 Obesity, unspecified: Secondary | ICD-10-CM | POA: Diagnosis not present

## 2016-03-31 DIAGNOSIS — G35 Multiple sclerosis: Secondary | ICD-10-CM | POA: Diagnosis not present

## 2016-03-31 DIAGNOSIS — M79602 Pain in left arm: Secondary | ICD-10-CM

## 2016-03-31 DIAGNOSIS — IMO0001 Reserved for inherently not codable concepts without codable children: Secondary | ICD-10-CM

## 2016-03-31 DIAGNOSIS — G4700 Insomnia, unspecified: Secondary | ICD-10-CM | POA: Diagnosis not present

## 2016-03-31 DIAGNOSIS — M47896 Other spondylosis, lumbar region: Secondary | ICD-10-CM | POA: Insufficient documentation

## 2016-03-31 DIAGNOSIS — M549 Dorsalgia, unspecified: Secondary | ICD-10-CM

## 2016-03-31 DIAGNOSIS — M791 Myalgia: Secondary | ICD-10-CM | POA: Diagnosis not present

## 2016-03-31 DIAGNOSIS — M545 Low back pain: Secondary | ICD-10-CM | POA: Insufficient documentation

## 2016-03-31 DIAGNOSIS — R160 Hepatomegaly, not elsewhere classified: Secondary | ICD-10-CM | POA: Diagnosis not present

## 2016-03-31 DIAGNOSIS — F17210 Nicotine dependence, cigarettes, uncomplicated: Secondary | ICD-10-CM | POA: Diagnosis not present

## 2016-03-31 DIAGNOSIS — F329 Major depressive disorder, single episode, unspecified: Secondary | ICD-10-CM | POA: Insufficient documentation

## 2016-03-31 DIAGNOSIS — G43909 Migraine, unspecified, not intractable, without status migrainosus: Secondary | ICD-10-CM | POA: Diagnosis not present

## 2016-03-31 DIAGNOSIS — Z79899 Other long term (current) drug therapy: Secondary | ICD-10-CM

## 2016-03-31 DIAGNOSIS — M4806 Spinal stenosis, lumbar region: Secondary | ICD-10-CM | POA: Diagnosis not present

## 2016-03-31 DIAGNOSIS — K3184 Gastroparesis: Secondary | ICD-10-CM | POA: Insufficient documentation

## 2016-03-31 DIAGNOSIS — G8929 Other chronic pain: Secondary | ICD-10-CM | POA: Diagnosis not present

## 2016-03-31 DIAGNOSIS — M79605 Pain in left leg: Secondary | ICD-10-CM | POA: Diagnosis not present

## 2016-03-31 DIAGNOSIS — R269 Unspecified abnormalities of gait and mobility: Secondary | ICD-10-CM

## 2016-03-31 DIAGNOSIS — M79606 Pain in leg, unspecified: Secondary | ICD-10-CM

## 2016-03-31 DIAGNOSIS — M609 Myositis, unspecified: Secondary | ICD-10-CM

## 2016-03-31 MED ORDER — GABAPENTIN 100 MG PO CAPS: 100.0000 mg | ORAL_CAPSULE | Freq: Every day | ORAL | Status: DC

## 2016-03-31 MED ORDER — GABAPENTIN 400 MG PO CAPS: 400.0000 mg | ORAL_CAPSULE | Freq: Two times a day (BID) | ORAL | Status: DC

## 2016-03-31 MED ORDER — DICLOFENAC SODIUM 1 % TD GEL: 2.0000 g | Freq: Four times a day (QID) | TRANSDERMAL | Status: DC

## 2016-03-31 MED ORDER — TRAMADOL HCL 50 MG PO TABS: 100.0000 mg | ORAL_TABLET | Freq: Four times a day (QID) | ORAL | Status: DC | PRN

## 2016-03-31 MED ORDER — NONFORMULARY OR COMPOUNDED ITEM: Status: DC
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
## 2016-03-31 NOTE — Addendum Note (Signed)
Addended by: Angela Nevin D on: 03/31/2016 01:59 PM   Modules accepted: Orders

## 2016-04-09 LAB — TOXASSURE SELECT,+ANTIDEPR,UR

## 2016-05-04 ENCOUNTER — Encounter: Payer: Self-pay | Admitting: Physical Medicine & Rehabilitation

## 2016-05-04 ENCOUNTER — Encounter
Payer: BLUE CROSS/BLUE SHIELD | Attending: Physical Medicine & Rehabilitation | Admitting: Physical Medicine & Rehabilitation

## 2016-05-04 VITALS — BP 144/84 | HR 90 | Resp 14

## 2016-05-04 DIAGNOSIS — E669 Obesity, unspecified: Secondary | ICD-10-CM | POA: Diagnosis not present

## 2016-05-04 DIAGNOSIS — R269 Unspecified abnormalities of gait and mobility: Secondary | ICD-10-CM | POA: Insufficient documentation

## 2016-05-04 DIAGNOSIS — G35 Multiple sclerosis: Secondary | ICD-10-CM | POA: Insufficient documentation

## 2016-05-04 DIAGNOSIS — G43909 Migraine, unspecified, not intractable, without status migrainosus: Secondary | ICD-10-CM | POA: Diagnosis not present

## 2016-05-04 DIAGNOSIS — M4806 Spinal stenosis, lumbar region: Secondary | ICD-10-CM | POA: Insufficient documentation

## 2016-05-04 DIAGNOSIS — F329 Major depressive disorder, single episode, unspecified: Secondary | ICD-10-CM | POA: Insufficient documentation

## 2016-05-04 DIAGNOSIS — M461 Sacroiliitis, not elsewhere classified: Secondary | ICD-10-CM | POA: Diagnosis not present

## 2016-05-04 DIAGNOSIS — G8929 Other chronic pain: Secondary | ICD-10-CM | POA: Insufficient documentation

## 2016-05-04 DIAGNOSIS — M549 Dorsalgia, unspecified: Secondary | ICD-10-CM | POA: Diagnosis present

## 2016-05-04 DIAGNOSIS — M47896 Other spondylosis, lumbar region: Secondary | ICD-10-CM | POA: Insufficient documentation

## 2016-05-04 DIAGNOSIS — R160 Hepatomegaly, not elsewhere classified: Secondary | ICD-10-CM | POA: Diagnosis not present

## 2016-05-04 DIAGNOSIS — G4700 Insomnia, unspecified: Secondary | ICD-10-CM | POA: Diagnosis not present

## 2016-05-04 DIAGNOSIS — F17210 Nicotine dependence, cigarettes, uncomplicated: Secondary | ICD-10-CM | POA: Insufficient documentation

## 2016-05-04 DIAGNOSIS — M545 Low back pain: Secondary | ICD-10-CM | POA: Diagnosis not present

## 2016-05-04 DIAGNOSIS — K3184 Gastroparesis: Secondary | ICD-10-CM | POA: Diagnosis not present

## 2016-05-04 DIAGNOSIS — M79605 Pain in left leg: Secondary | ICD-10-CM | POA: Diagnosis not present

## 2016-05-04 MED ORDER — MELOXICAM 15 MG PO TABS: 15.0000 mg | ORAL_TABLET | Freq: Every day | ORAL | Status: DC

## 2016-05-04 MED ORDER — GABAPENTIN 400 MG PO CAPS: 400.0000 mg | ORAL_CAPSULE | Freq: Two times a day (BID) | ORAL | Status: DC

## 2016-05-04 MED ORDER — DICLOFENAC SODIUM 1 % TD GEL: 2.0000 g | Freq: Four times a day (QID) | TRANSDERMAL | Status: DC

## 2016-05-04 MED ORDER — OXYCODONE HCL 5 MG PO TABS: 5.0000 mg | ORAL_TABLET | Freq: Two times a day (BID) | ORAL | Status: DC | PRN

## 2016-05-04 MED ORDER — METHOCARBAMOL 500 MG PO TABS: 500.0000 mg | ORAL_TABLET | Freq: Three times a day (TID) | ORAL | Status: DC | PRN

## 2016-05-04 MED ORDER — TRAMADOL HCL 50 MG PO TABS: 100.0000 mg | ORAL_TABLET | Freq: Two times a day (BID) | ORAL | Status: DC | PRN

## 2016-05-04 MED ORDER — GABAPENTIN 100 MG PO CAPS: 200.0000 mg | ORAL_CAPSULE | Freq: Every day | ORAL | Status: DC
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
## 2016-05-19 ENCOUNTER — Other Ambulatory Visit: Payer: Self-pay | Admitting: Physical Medicine & Rehabilitation

## 2016-05-20 NOTE — Telephone Encounter (Signed)
rec'd electronic request for Robaxin refill, your note stated 'Cont Robaxin 500prn (~2/week) '.......Marland Kitchenplease advise

## 2016-06-03 ENCOUNTER — Other Ambulatory Visit: Payer: Self-pay | Admitting: Physical Medicine & Rehabilitation

## 2016-06-15 ENCOUNTER — Encounter: Payer: BLUE CROSS/BLUE SHIELD | Admitting: Physical Medicine & Rehabilitation

## 2016-06-23 ENCOUNTER — Encounter: Payer: Self-pay | Admitting: Physical Medicine & Rehabilitation

## 2016-06-23 ENCOUNTER — Encounter
Payer: BLUE CROSS/BLUE SHIELD | Attending: Physical Medicine & Rehabilitation | Admitting: Physical Medicine & Rehabilitation

## 2016-06-23 VITALS — BP 146/81 | HR 112 | Resp 14

## 2016-06-23 DIAGNOSIS — G8929 Other chronic pain: Secondary | ICD-10-CM | POA: Insufficient documentation

## 2016-06-23 DIAGNOSIS — F329 Major depressive disorder, single episode, unspecified: Secondary | ICD-10-CM | POA: Insufficient documentation

## 2016-06-23 DIAGNOSIS — M791 Myalgia: Secondary | ICD-10-CM | POA: Diagnosis not present

## 2016-06-23 DIAGNOSIS — M545 Low back pain, unspecified: Secondary | ICD-10-CM

## 2016-06-23 DIAGNOSIS — Z5181 Encounter for therapeutic drug level monitoring: Secondary | ICD-10-CM

## 2016-06-23 DIAGNOSIS — E669 Obesity, unspecified: Secondary | ICD-10-CM | POA: Diagnosis not present

## 2016-06-23 DIAGNOSIS — M609 Myositis, unspecified: Secondary | ICD-10-CM

## 2016-06-23 DIAGNOSIS — IMO0001 Reserved for inherently not codable concepts without codable children: Secondary | ICD-10-CM

## 2016-06-23 DIAGNOSIS — G43909 Migraine, unspecified, not intractable, without status migrainosus: Secondary | ICD-10-CM | POA: Insufficient documentation

## 2016-06-23 DIAGNOSIS — R160 Hepatomegaly, not elsewhere classified: Secondary | ICD-10-CM | POA: Insufficient documentation

## 2016-06-23 DIAGNOSIS — M4806 Spinal stenosis, lumbar region: Secondary | ICD-10-CM | POA: Insufficient documentation

## 2016-06-23 DIAGNOSIS — M461 Sacroiliitis, not elsewhere classified: Secondary | ICD-10-CM

## 2016-06-23 DIAGNOSIS — G4700 Insomnia, unspecified: Secondary | ICD-10-CM | POA: Diagnosis not present

## 2016-06-23 DIAGNOSIS — Z79899 Other long term (current) drug therapy: Secondary | ICD-10-CM

## 2016-06-23 DIAGNOSIS — K3184 Gastroparesis: Secondary | ICD-10-CM | POA: Insufficient documentation

## 2016-06-23 DIAGNOSIS — M549 Dorsalgia, unspecified: Secondary | ICD-10-CM | POA: Diagnosis present

## 2016-06-23 DIAGNOSIS — F17210 Nicotine dependence, cigarettes, uncomplicated: Secondary | ICD-10-CM | POA: Insufficient documentation

## 2016-06-23 DIAGNOSIS — G35 Multiple sclerosis: Secondary | ICD-10-CM | POA: Insufficient documentation

## 2016-06-23 DIAGNOSIS — R269 Unspecified abnormalities of gait and mobility: Secondary | ICD-10-CM | POA: Diagnosis not present

## 2016-06-23 DIAGNOSIS — M79605 Pain in left leg: Secondary | ICD-10-CM | POA: Diagnosis not present

## 2016-06-23 DIAGNOSIS — M47819 Spondylosis without myelopathy or radiculopathy, site unspecified: Secondary | ICD-10-CM

## 2016-06-23 DIAGNOSIS — M47896 Other spondylosis, lumbar region: Secondary | ICD-10-CM | POA: Insufficient documentation

## 2016-06-23 MED ORDER — TRAMADOL HCL 50 MG PO TABS: 100.0000 mg | ORAL_TABLET | Freq: Two times a day (BID) | ORAL | 0 refills | Status: DC | PRN

## 2016-06-23 MED ORDER — GABAPENTIN 400 MG PO CAPS: 400.0000 mg | ORAL_CAPSULE | Freq: Two times a day (BID) | ORAL | 1 refills | Status: DC

## 2016-06-23 MED ORDER — MELOXICAM 15 MG PO TABS: 15.0000 mg | ORAL_TABLET | Freq: Every day | ORAL | 1 refills | Status: DC

## 2016-06-23 MED ORDER — DICLOFENAC SODIUM 1 % TD GEL: 2.0000 g | Freq: Four times a day (QID) | TRANSDERMAL | 1 refills | Status: DC

## 2016-06-23 MED ORDER — GABAPENTIN 100 MG PO CAPS: 200.0000 mg | ORAL_CAPSULE | Freq: Every day | ORAL | 1 refills | Status: DC

## 2016-06-23 MED ORDER — OXYCODONE HCL 5 MG PO TABS: 5.0000 mg | ORAL_TABLET | Freq: Two times a day (BID) | ORAL | 0 refills | Status: DC | PRN

## 2016-06-23 NOTE — Addendum Note (Signed)
Addended by: Angela Nevin D on: 06/23/2016 03:01 PM   Modules accepted: Orders
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
## 2016-07-01 LAB — TOXASSURE SELECT,+ANTIDEPR,UR: PDF: 0

## 2016-07-05 ENCOUNTER — Telehealth: Payer: Self-pay

## 2016-07-05 NOTE — Telephone Encounter (Signed)
Pt's UDS had a positive level of Buprenorphine and its metabolite- Norbuprenorphine. Pt does not have Butrans on med list. Please advise.

## 2016-07-05 NOTE — Telephone Encounter (Signed)
Left message to inform pt

## 2016-07-05 NOTE — Telephone Encounter (Signed)
We may inform patient of the test results and that we will no longer be able to prescribe her narcotics, due to this being her second inconsistent test result.  She is currently scheduled for biowave and should continue to follow up for non-narcotic pain management.  Thanks.

## 2016-07-07 NOTE — Telephone Encounter (Signed)
Left message for pt to return call.

## 2016-08-03 DIAGNOSIS — Q249 Congenital malformation of heart, unspecified: Secondary | ICD-10-CM

## 2016-08-03 HISTORY — DX: Congenital malformation of heart, unspecified: Q24.9

## 2016-08-22 ENCOUNTER — Encounter: Payer: BLUE CROSS/BLUE SHIELD | Admitting: Registered Nurse

## 2016-08-24 ENCOUNTER — Encounter: Payer: BLUE CROSS/BLUE SHIELD | Attending: Physical Medicine & Rehabilitation | Admitting: Registered Nurse

## 2016-08-24 ENCOUNTER — Ambulatory Visit: Payer: BLUE CROSS/BLUE SHIELD | Admitting: Physical Medicine & Rehabilitation

## 2016-08-24 ENCOUNTER — Encounter: Payer: Self-pay | Admitting: Registered Nurse

## 2016-08-24 VITALS — BP 164/76 | HR 101

## 2016-08-24 DIAGNOSIS — K3184 Gastroparesis: Secondary | ICD-10-CM | POA: Diagnosis not present

## 2016-08-24 DIAGNOSIS — R160 Hepatomegaly, not elsewhere classified: Secondary | ICD-10-CM | POA: Diagnosis not present

## 2016-08-24 DIAGNOSIS — M545 Low back pain, unspecified: Secondary | ICD-10-CM

## 2016-08-24 DIAGNOSIS — F17210 Nicotine dependence, cigarettes, uncomplicated: Secondary | ICD-10-CM | POA: Insufficient documentation

## 2016-08-24 DIAGNOSIS — G894 Chronic pain syndrome: Secondary | ICD-10-CM

## 2016-08-24 DIAGNOSIS — M79605 Pain in left leg: Secondary | ICD-10-CM | POA: Insufficient documentation

## 2016-08-24 DIAGNOSIS — R269 Unspecified abnormalities of gait and mobility: Secondary | ICD-10-CM | POA: Diagnosis not present

## 2016-08-24 DIAGNOSIS — G4700 Insomnia, unspecified: Secondary | ICD-10-CM | POA: Diagnosis not present

## 2016-08-24 DIAGNOSIS — G8929 Other chronic pain: Secondary | ICD-10-CM | POA: Insufficient documentation

## 2016-08-24 DIAGNOSIS — F329 Major depressive disorder, single episode, unspecified: Secondary | ICD-10-CM | POA: Diagnosis not present

## 2016-08-24 DIAGNOSIS — M48061 Spinal stenosis, lumbar region without neurogenic claudication: Secondary | ICD-10-CM | POA: Diagnosis not present

## 2016-08-24 DIAGNOSIS — M47896 Other spondylosis, lumbar region: Secondary | ICD-10-CM | POA: Diagnosis not present

## 2016-08-24 DIAGNOSIS — M1288 Other specific arthropathies, not elsewhere classified, other specified site: Secondary | ICD-10-CM | POA: Diagnosis not present

## 2016-08-24 DIAGNOSIS — M7062 Trochanteric bursitis, left hip: Secondary | ICD-10-CM

## 2016-08-24 DIAGNOSIS — G43909 Migraine, unspecified, not intractable, without status migrainosus: Secondary | ICD-10-CM | POA: Insufficient documentation

## 2016-08-24 DIAGNOSIS — E669 Obesity, unspecified: Secondary | ICD-10-CM | POA: Insufficient documentation

## 2016-08-24 DIAGNOSIS — G35 Multiple sclerosis: Secondary | ICD-10-CM | POA: Diagnosis not present

## 2016-08-24 DIAGNOSIS — M47819 Spondylosis without myelopathy or radiculopathy, site unspecified: Secondary | ICD-10-CM

## 2016-08-24 DIAGNOSIS — M549 Dorsalgia, unspecified: Secondary | ICD-10-CM | POA: Diagnosis present

## 2016-08-24 MED ORDER — GABAPENTIN 400 MG PO CAPS: 400.0000 mg | ORAL_CAPSULE | Freq: Two times a day (BID) | ORAL | 1 refills | Status: DC

## 2016-08-24 MED ORDER — MELOXICAM 15 MG PO TABS: 15.0000 mg | ORAL_TABLET | Freq: Every day | ORAL | 1 refills | Status: DC

## 2016-08-24 MED ORDER — DICLOFENAC SODIUM 1 % TD GEL: 2.0000 g | Freq: Four times a day (QID) | TRANSDERMAL | 1 refills | Status: DC

## 2016-08-24 MED ORDER — TRAMADOL HCL 50 MG PO TABS: 100.0000 mg | ORAL_TABLET | Freq: Two times a day (BID) | ORAL | 0 refills | Status: DC | PRN

## 2016-08-24 MED ORDER — GABAPENTIN 100 MG PO CAPS: 200.0000 mg | ORAL_CAPSULE | Freq: Every day | ORAL | 1 refills | Status: DC

## 2016-08-24 MED ORDER — OXYCODONE HCL 5 MG PO TABS: 5.0000 mg | ORAL_TABLET | Freq: Two times a day (BID) | ORAL | 0 refills | Status: DC | PRN
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
## 2016-08-25 ENCOUNTER — Telehealth: Payer: Self-pay

## 2016-08-25 ENCOUNTER — Ambulatory Visit: Payer: BLUE CROSS/BLUE SHIELD | Admitting: Registered Nurse

## 2016-08-25 NOTE — Telephone Encounter (Signed)
Called patient LMOM needing details on why she is usings the diclofenac sodium 1% gel. Unable to process through insurance for reasoning on chart of her back. Needs to be for other area and will have to get an xray for proof and sent for PA

## 2016-08-25 NOTE — Telephone Encounter (Signed)
PA approved through Cover my meds for Diclofenac Sodium 1% gel

## 2016-08-30 ENCOUNTER — Ambulatory Visit: Payer: BLUE CROSS/BLUE SHIELD | Admitting: Registered Nurse

## 2016-09-01 ENCOUNTER — Ambulatory Visit: Payer: BLUE CROSS/BLUE SHIELD | Admitting: Registered Nurse

## 2016-09-02 ENCOUNTER — Ambulatory Visit: Payer: BLUE CROSS/BLUE SHIELD | Admitting: Registered Nurse

## 2016-09-05 ENCOUNTER — Ambulatory Visit: Payer: BLUE CROSS/BLUE SHIELD | Admitting: Registered Nurse

## 2016-09-06 ENCOUNTER — Ambulatory Visit: Payer: BLUE CROSS/BLUE SHIELD | Admitting: Registered Nurse

## 2016-09-08 ENCOUNTER — Ambulatory Visit: Payer: BLUE CROSS/BLUE SHIELD | Admitting: Registered Nurse

## 2016-09-12 ENCOUNTER — Ambulatory Visit: Payer: BLUE CROSS/BLUE SHIELD | Admitting: Registered Nurse

## 2016-09-14 ENCOUNTER — Ambulatory Visit: Payer: BLUE CROSS/BLUE SHIELD | Admitting: Registered Nurse

## 2016-09-15 ENCOUNTER — Ambulatory Visit: Payer: BLUE CROSS/BLUE SHIELD | Admitting: Registered Nurse

## 2016-09-23 ENCOUNTER — Telehealth: Payer: Self-pay | Admitting: Physical Medicine & Rehabilitation

## 2016-09-23 NOTE — Telephone Encounter (Signed)
Patient will need a refill on her oxycodone, she runs out of it next week.  Please call patient.

## 2016-09-23 NOTE — Telephone Encounter (Signed)
Ms Teresa Ochoa needs to come in for a visit Monday or Tuesday  to see [facility]. She was to be non narcotic due to past UDS results.

## 2016-09-27 ENCOUNTER — Encounter: Payer: Self-pay | Admitting: Registered Nurse

## 2016-09-27 ENCOUNTER — Encounter (INDEPENDENT_AMBULATORY_CARE_PROVIDER_SITE_OTHER): Payer: Self-pay

## 2016-09-27 ENCOUNTER — Encounter: Payer: BLUE CROSS/BLUE SHIELD | Attending: Physical Medicine & Rehabilitation | Admitting: Registered Nurse

## 2016-09-27 VITALS — BP 140/78 | HR 97 | Resp 14

## 2016-09-27 DIAGNOSIS — M545 Low back pain, unspecified: Secondary | ICD-10-CM

## 2016-09-27 DIAGNOSIS — M48061 Spinal stenosis, lumbar region without neurogenic claudication: Secondary | ICD-10-CM | POA: Diagnosis not present

## 2016-09-27 DIAGNOSIS — M1288 Other specific arthropathies, not elsewhere classified, other specified site: Secondary | ICD-10-CM

## 2016-09-27 DIAGNOSIS — G35 Multiple sclerosis: Secondary | ICD-10-CM | POA: Insufficient documentation

## 2016-09-27 DIAGNOSIS — G8929 Other chronic pain: Secondary | ICD-10-CM | POA: Diagnosis not present

## 2016-09-27 DIAGNOSIS — M79605 Pain in left leg: Secondary | ICD-10-CM | POA: Diagnosis not present

## 2016-09-27 DIAGNOSIS — F17210 Nicotine dependence, cigarettes, uncomplicated: Secondary | ICD-10-CM | POA: Insufficient documentation

## 2016-09-27 DIAGNOSIS — G4700 Insomnia, unspecified: Secondary | ICD-10-CM | POA: Diagnosis not present

## 2016-09-27 DIAGNOSIS — Z79899 Other long term (current) drug therapy: Secondary | ICD-10-CM

## 2016-09-27 DIAGNOSIS — M7062 Trochanteric bursitis, left hip: Secondary | ICD-10-CM

## 2016-09-27 DIAGNOSIS — G894 Chronic pain syndrome: Secondary | ICD-10-CM

## 2016-09-27 DIAGNOSIS — M47819 Spondylosis without myelopathy or radiculopathy, site unspecified: Secondary | ICD-10-CM

## 2016-09-27 DIAGNOSIS — K3184 Gastroparesis: Secondary | ICD-10-CM | POA: Diagnosis not present

## 2016-09-27 DIAGNOSIS — R160 Hepatomegaly, not elsewhere classified: Secondary | ICD-10-CM | POA: Diagnosis not present

## 2016-09-27 DIAGNOSIS — R269 Unspecified abnormalities of gait and mobility: Secondary | ICD-10-CM | POA: Insufficient documentation

## 2016-09-27 DIAGNOSIS — E669 Obesity, unspecified: Secondary | ICD-10-CM | POA: Diagnosis not present

## 2016-09-27 DIAGNOSIS — M549 Dorsalgia, unspecified: Secondary | ICD-10-CM | POA: Diagnosis present

## 2016-09-27 DIAGNOSIS — Z5181 Encounter for therapeutic drug level monitoring: Secondary | ICD-10-CM

## 2016-09-27 DIAGNOSIS — M47896 Other spondylosis, lumbar region: Secondary | ICD-10-CM | POA: Insufficient documentation

## 2016-09-27 DIAGNOSIS — F329 Major depressive disorder, single episode, unspecified: Secondary | ICD-10-CM | POA: Insufficient documentation

## 2016-09-27 DIAGNOSIS — M5416 Radiculopathy, lumbar region: Secondary | ICD-10-CM

## 2016-09-27 DIAGNOSIS — G43909 Migraine, unspecified, not intractable, without status migrainosus: Secondary | ICD-10-CM | POA: Insufficient documentation

## 2016-09-27 MED ORDER — TRAMADOL HCL 50 MG PO TABS: 100.0000 mg | ORAL_TABLET | Freq: Three times a day (TID) | ORAL | 0 refills | Status: DC | PRN

## 2016-09-27 MED ORDER — GABAPENTIN 100 MG PO CAPS: 200.0000 mg | ORAL_CAPSULE | Freq: Every day | ORAL | 1 refills | Status: DC

## 2016-09-27 MED ORDER — GABAPENTIN 400 MG PO CAPS: 400.0000 mg | ORAL_CAPSULE | Freq: Two times a day (BID) | ORAL | 1 refills | Status: DC

## 2016-09-27 NOTE — Addendum Note (Signed)
Addended by: Angela Nevin D on: 09/27/2016 01:51 PM   Modules accepted: Orders
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
## 2016-10-04 LAB — TOXASSURE SELECT,+ANTIDEPR,UR

## 2016-10-05 ENCOUNTER — Telehealth: Payer: Self-pay | Admitting: *Deleted

## 2016-10-05 NOTE — Progress Notes (Signed)
Urine Drug screen for this encounter is inconsistent. Positive for unprescribed methadone. Patient will be non narcotic treatment only.

## 2016-10-05 NOTE — Telephone Encounter (Signed)
Teresa Ochoa called and is asking about her UDS. I have called her back and informed her that there is unprescribed methadone in her urine and that we will no longer prescribe her narcotics.  She said ok thank you.

## 2016-10-26 ENCOUNTER — Encounter: Payer: BLUE CROSS/BLUE SHIELD | Admitting: Physical Medicine & Rehabilitation

## 2016-10-27 ENCOUNTER — Encounter: Payer: Self-pay | Admitting: Registered Nurse

## 2016-10-27 ENCOUNTER — Encounter: Payer: BLUE CROSS/BLUE SHIELD | Attending: Physical Medicine & Rehabilitation | Admitting: Registered Nurse

## 2016-10-27 ENCOUNTER — Ambulatory Visit: Payer: BLUE CROSS/BLUE SHIELD | Admitting: Physical Medicine & Rehabilitation

## 2016-10-27 VITALS — BP 134/80 | HR 92

## 2016-10-27 DIAGNOSIS — R160 Hepatomegaly, not elsewhere classified: Secondary | ICD-10-CM | POA: Diagnosis not present

## 2016-10-27 DIAGNOSIS — M545 Low back pain: Secondary | ICD-10-CM | POA: Diagnosis not present

## 2016-10-27 DIAGNOSIS — M1288 Other specific arthropathies, not elsewhere classified, other specified site: Secondary | ICD-10-CM

## 2016-10-27 DIAGNOSIS — M48061 Spinal stenosis, lumbar region without neurogenic claudication: Secondary | ICD-10-CM | POA: Diagnosis not present

## 2016-10-27 DIAGNOSIS — E669 Obesity, unspecified: Secondary | ICD-10-CM | POA: Insufficient documentation

## 2016-10-27 DIAGNOSIS — K3184 Gastroparesis: Secondary | ICD-10-CM | POA: Insufficient documentation

## 2016-10-27 DIAGNOSIS — G894 Chronic pain syndrome: Secondary | ICD-10-CM | POA: Diagnosis not present

## 2016-10-27 DIAGNOSIS — M47896 Other spondylosis, lumbar region: Secondary | ICD-10-CM | POA: Diagnosis not present

## 2016-10-27 DIAGNOSIS — G4700 Insomnia, unspecified: Secondary | ICD-10-CM | POA: Insufficient documentation

## 2016-10-27 DIAGNOSIS — F329 Major depressive disorder, single episode, unspecified: Secondary | ICD-10-CM | POA: Insufficient documentation

## 2016-10-27 DIAGNOSIS — G8929 Other chronic pain: Secondary | ICD-10-CM | POA: Diagnosis not present

## 2016-10-27 DIAGNOSIS — G35 Multiple sclerosis: Secondary | ICD-10-CM | POA: Insufficient documentation

## 2016-10-27 DIAGNOSIS — M79605 Pain in left leg: Secondary | ICD-10-CM | POA: Insufficient documentation

## 2016-10-27 DIAGNOSIS — R269 Unspecified abnormalities of gait and mobility: Secondary | ICD-10-CM | POA: Diagnosis not present

## 2016-10-27 DIAGNOSIS — G43909 Migraine, unspecified, not intractable, without status migrainosus: Secondary | ICD-10-CM | POA: Diagnosis not present

## 2016-10-27 DIAGNOSIS — F17210 Nicotine dependence, cigarettes, uncomplicated: Secondary | ICD-10-CM | POA: Insufficient documentation

## 2016-10-27 DIAGNOSIS — M5416 Radiculopathy, lumbar region: Secondary | ICD-10-CM

## 2016-10-27 DIAGNOSIS — M47819 Spondylosis without myelopathy or radiculopathy, site unspecified: Secondary | ICD-10-CM

## 2016-10-27 DIAGNOSIS — M549 Dorsalgia, unspecified: Secondary | ICD-10-CM | POA: Diagnosis present

## 2016-10-27 MED ORDER — MELOXICAM 15 MG PO TABS: 15.0000 mg | ORAL_TABLET | Freq: Every day | ORAL | 1 refills | Status: DC

## 2016-10-27 MED ORDER — GABAPENTIN 400 MG PO CAPS: 400.0000 mg | ORAL_CAPSULE | Freq: Two times a day (BID) | ORAL | 1 refills | Status: DC

## 2016-10-27 MED ORDER — GABAPENTIN 100 MG PO CAPS: 200.0000 mg | ORAL_CAPSULE | Freq: Every day | ORAL | 1 refills | Status: DC
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
## 2016-12-16 ENCOUNTER — Telehealth: Payer: Self-pay | Admitting: Physical Medicine & Rehabilitation

## 2016-12-16 NOTE — Telephone Encounter (Signed)
Recd LTD benefi req from [employer]) ppwrk to AP to complete - may need a referral as full physical assessment is attached to paperwork requrest = to AP inbasket

## 2016-12-16 NOTE — Telephone Encounter (Signed)
I have not seen this patient for months and on last visit with NP she was told to follow up with me in a month, which she has not done.  I will not fill out LTD paperwork without seeing the patient again.  Thanks.

## 2016-12-19 NOTE — Telephone Encounter (Signed)
Received faxed disability papers again.  Needs appt.  Message sent to [facility].

## 2017-01-04 ENCOUNTER — Telehealth: Payer: Self-pay | Admitting: *Deleted

## 2017-01-04 DIAGNOSIS — M47819 Spondylosis without myelopathy or radiculopathy, site unspecified: Secondary | ICD-10-CM

## 2017-01-04 DIAGNOSIS — M549 Dorsalgia, unspecified: Secondary | ICD-10-CM

## 2017-01-04 DIAGNOSIS — M79605 Pain in left leg: Secondary | ICD-10-CM

## 2017-01-04 DIAGNOSIS — G894 Chronic pain syndrome: Secondary | ICD-10-CM

## 2017-01-04 DIAGNOSIS — M791 Myalgia, unspecified site: Secondary | ICD-10-CM

## 2017-01-04 DIAGNOSIS — G8929 Other chronic pain: Secondary | ICD-10-CM

## 2017-01-04 NOTE — Telephone Encounter (Signed)
Orders placed for an FCE with PT

## 2018-05-28 ENCOUNTER — Encounter (HOSPITAL_BASED_OUTPATIENT_CLINIC_OR_DEPARTMENT_OTHER): Payer: Self-pay

## 2018-05-28 ENCOUNTER — Other Ambulatory Visit: Payer: Self-pay

## 2018-05-28 ENCOUNTER — Emergency Department (HOSPITAL_BASED_OUTPATIENT_CLINIC_OR_DEPARTMENT_OTHER)
Admission: EM | Admit: 2018-05-28 | Discharge: 2018-05-28 | Disposition: A | Discharge status: Home / Self Care | Payer: 59 | Attending: Emergency Medicine | Admitting: Emergency Medicine

## 2018-05-28 ENCOUNTER — Emergency Department (HOSPITAL_BASED_OUTPATIENT_CLINIC_OR_DEPARTMENT_OTHER): Payer: 59

## 2018-05-28 DIAGNOSIS — R1013 Epigastric pain: Secondary | ICD-10-CM | POA: Diagnosis not present

## 2018-05-28 DIAGNOSIS — F17200 Nicotine dependence, unspecified, uncomplicated: Secondary | ICD-10-CM | POA: Insufficient documentation

## 2018-05-28 DIAGNOSIS — R0602 Shortness of breath: Secondary | ICD-10-CM | POA: Diagnosis present

## 2018-05-28 DIAGNOSIS — G35 Multiple sclerosis: Secondary | ICD-10-CM | POA: Insufficient documentation

## 2018-05-28 DIAGNOSIS — Z79899 Other long term (current) drug therapy: Secondary | ICD-10-CM | POA: Diagnosis not present

## 2018-05-28 LAB — URINALYSIS, ROUTINE W REFLEX MICROSCOPIC
Bilirubin Urine: NEGATIVE
GLUCOSE, UA: NEGATIVE mg/dL
KETONES UR: NEGATIVE mg/dL
LEUKOCYTES UA: NEGATIVE
Nitrite: NEGATIVE
PROTEIN: NEGATIVE mg/dL
Specific Gravity, Urine: 1.025 (ref 1.005–1.030)
pH: 6 (ref 5.0–8.0)

## 2018-05-28 LAB — CBC WITH DIFFERENTIAL/PLATELET
BASOS ABS: 0 10*3/uL (ref 0.0–0.1)
BASOS PCT: 0 %
EOS PCT: 0 %
Eosinophils Absolute: 0 10*3/uL (ref 0.0–0.7)
HCT: 41.8 % (ref 36.0–46.0)
Hemoglobin: 14.3 g/dL (ref 12.0–15.0)
LYMPHS PCT: 17 %
Lymphs Abs: 2 10*3/uL (ref 0.7–4.0)
MCH: 30 pg (ref 26.0–34.0)
MCHC: 34.2 g/dL (ref 30.0–36.0)
MCV: 87.8 fL (ref 78.0–100.0)
MONO ABS: 0.4 10*3/uL (ref 0.1–1.0)
MONOS PCT: 3 %
Neutro Abs: 9.6 10*3/uL — ABNORMAL HIGH (ref 1.7–7.7)
Neutrophils Relative %: 80 %
PLATELETS: 280 10*3/uL (ref 150–400)
RBC: 4.76 MIL/uL (ref 3.87–5.11)
RDW: 13.7 % (ref 11.5–15.5)
WBC: 12.1 10*3/uL — ABNORMAL HIGH (ref 4.0–10.5)

## 2018-05-28 LAB — COMPREHENSIVE METABOLIC PANEL
ALT: 15 U/L (ref 0–44)
AST: 21 U/L (ref 15–41)
Albumin: 4.6 g/dL (ref 3.5–5.0)
Alkaline Phosphatase: 70 U/L (ref 38–126)
Anion gap: 8 (ref 5–15)
BILIRUBIN TOTAL: 0.3 mg/dL (ref 0.3–1.2)
BUN: 12 mg/dL (ref 6–20)
CO2: 26 mmol/L (ref 22–32)
CREATININE: 0.63 mg/dL (ref 0.44–1.00)
Calcium: 9.2 mg/dL (ref 8.9–10.3)
Chloride: 105 mmol/L (ref 98–111)
GFR calc Af Amer: >60 mL/min (ref >60)
Glucose, Bld: 134 mg/dL — ABNORMAL HIGH (ref 70–99)
Potassium: 4 mmol/L (ref 3.5–5.1)
Sodium: 139 mmol/L (ref 135–145)
TOTAL PROTEIN: 7.6 g/dL (ref 6.5–8.1)

## 2018-05-28 LAB — URINALYSIS, MICROSCOPIC (REFLEX)

## 2018-05-28 LAB — LIPASE, BLOOD: LIPASE: 27 U/L (ref 11–51)

## 2018-05-28 MED ORDER — SUCRALFATE 1 G PO TABS: 1.0000 g | ORAL_TABLET | Freq: Three times a day (TID) | ORAL | 0 refills | Status: DC

## 2018-05-28 MED ORDER — OMEPRAZOLE 20 MG PO CPDR: 20.0000 mg | DELAYED_RELEASE_CAPSULE | Freq: Every day | ORAL | 0 refills | Status: DC

## 2018-05-28 MED ORDER — GI COCKTAIL ~~LOC~~
30.0000 mL | Freq: Once | ORAL | Status: AC
  Administered 2018-05-28: 30 mL via ORAL
  Filled 2018-05-28: qty 30

## 2018-05-28 NOTE — Discharge Instructions (Addendum)
Please follow up with your PCP and Gi.

## 2018-05-28 NOTE — ED Provider Notes (Signed)
[facility] EMERGENCY DEPARTMENT Provider Note   CSN: [id number] Arrival date & time: 05/28/18  1154     History   Chief Complaint Chief Complaint  Patient presents with  . Shortness of Breath    HPI Teresa Ochoa is a 37 y.o. female with a past medical history of MS, substance abuse, depression, status post hysterectomy, who presents today for evaluation of bilateral lower anterior abdominal pain.  She reports that intermittently for the past year she has had this pain, however for the past few days it has been worse than usual.  She reports that she has had pancreatitis in the past and this feels similar.  She denies any fevers or chills.  She is status post appendectomy and cholecystectomy.  She reports decreased appetite secondary to epigastric burning pain, along with bad taste in her mouth.  HPI  Past Medical History:  Diagnosis Date  . Depression    Reports more depression since the accident   . Enlarged liver   . Gastroparesis   . Heart abnormality 08/03/2016   place on Heart monitor for Afib  . Migraine   . Migraines 01/21/2016  . MS (multiple sclerosis) (HCC)   . Substance abuse [facility])     Patient Active Problem List   Diagnosis Date Noted  . Chronic leg pain 03/31/2016  . Chronic back pain 03/31/2016  . Migraines 01/21/2016  . Enlarged liver     Past Surgical History:  Procedure Laterality Date  . ABDOMINAL HYSTERECTOMY    . APPENDECTOMY    . GALLBLADDER SURGERY    . JOINT REPLACEMENT    . SALIVARY GLAND SURGERY    . TUBAL LIGATION       OB History   None      Home Medications    Prior to Admission medications   Medication Sig Start Date End Date Taking? Authorizing Provider  diclofenac sodium (VOLTAREN) 1 % GEL Apply 2 g topically 4 (four) times daily. 08/24/16   Jones Bales, NP  gabapentin (NEURONTIN) 100 MG capsule Take 2 capsules (200 mg total) by mouth at bedtime. 10/27/16   Jones Bales, NP  gabapentin (NEURONTIN) 400  MG capsule Take 1 capsule (400 mg total) by mouth 2 (two) times daily. 10/27/16   Jones Bales, NP  meloxicam (MOBIC) 15 MG tablet Take 1 tablet (15 mg total) by mouth daily. 10/27/16   Jones Bales, NP  methocarbamol (ROBAXIN) 500 MG tablet TAKE ONE TABLET BY MOUTH EVERY 8 HOURS AS NEEDED FOR MUSCLE SPASMS 05/20/16   Marcello Fennel, MD  Multiple Vitamins-Minerals (THRIVE FOR LIFE WOMENS PO) Take by mouth.    [provider]  omeprazole (PRILOSEC) 20 MG capsule Take 1 capsule (20 mg total) by mouth daily. 05/28/18   Cristina Gong, PA-C  sucralfate (CARAFATE) 1 g tablet Take 1 tablet (1 g total) by mouth 4 (four) times daily -  with meals and at bedtime for 10 days. 05/28/18 06/07/18  Cristina Gong, PA-C    Family History Family History  Problem Relation Age of Onset  . Heart disease Maternal Grandmother   . Diabetes Maternal Grandmother   . Hypertension Maternal Grandmother     Social History Social History   Tobacco Use  . Smoking status: Current Every Day Smoker    Packs/day: 1.00  . Smokeless tobacco: Never Used  Substance Use Topics  . Alcohol use: Yes    Comment: occ  . Drug use: No  Allergies   Ketorolac; Macrobid [nitrofurantoin monohyd macro]; Other; Reglan [metoclopramide]; Tylenol [acetaminophen]; and Compazine   Review of Systems Review of Systems  Constitutional: Negative for chills and fever.  HENT: Negative for ear pain and sore throat.   Eyes: Negative for pain and visual disturbance.  Respiratory: Negative for cough and shortness of breath.   Cardiovascular: Negative for chest pain and palpitations.  Gastrointestinal: Positive for abdominal pain and nausea. Negative for vomiting.  Genitourinary: Positive for hematuria (Chronic problem for patient, unchanged. ). Negative for dysuria.  Musculoskeletal: Negative for arthralgias, back pain and neck pain.  Skin: Negative for color change and rash.  Neurological: Negative for  seizures and syncope.  All other systems reviewed and are negative.    Physical Exam Updated Vital Signs BP 125/81   Pulse 76   Temp 98.3 F (36.8 C) (Oral)   Resp 16   Ht 5\' 3"  (1.6 m)   Wt 89.4 kg (197 lb)   SpO2 98%   BMI 34.90 kg/m   Physical Exam  Constitutional: She appears well-developed and well-nourished.  Non-toxic appearance. No distress.  HENT:  Head: Normocephalic and atraumatic.  Mouth/Throat: Oropharynx is clear and moist.  Eyes: Conjunctivae are normal. Right eye exhibits no discharge. Left eye exhibits no discharge. No scleral icterus.  Neck: Normal range of motion.  Cardiovascular: Normal rate, regular rhythm, normal heart sounds and intact distal pulses.  Pulmonary/Chest: Effort normal and breath sounds normal. No stridor. No respiratory distress. She has no decreased breath sounds. She has no wheezes.  Abdominal: Soft. Bowel sounds are normal. She exhibits no distension. There is tenderness in the right upper quadrant, epigastric area and left upper quadrant. There is no rigidity, no rebound and no guarding.  Musculoskeletal: She exhibits no edema or deformity.  Neurological: She is alert. She exhibits normal muscle tone.  Skin: Skin is warm and dry. She is not diaphoretic.  Psychiatric: She has a normal mood and affect. Her behavior is normal.  Nursing note and vitals reviewed.    ED Treatments / Results  Labs (all labs ordered are listed, but only abnormal results are displayed) Labs Reviewed  COMPREHENSIVE METABOLIC PANEL - Abnormal; Notable for the following components:      Result Value   Glucose, Bld 134 (*)    All other components within normal limits  CBC WITH DIFFERENTIAL/PLATELET - Abnormal; Notable for the following components:   WBC 12.1 (*)    Neutro Abs 9.6 (*)    All other components within normal limits  URINALYSIS, ROUTINE W REFLEX MICROSCOPIC - Abnormal; Notable for the following components:   Hgb urine dipstick MODERATE (*)     All other components within normal limits  URINALYSIS, MICROSCOPIC (REFLEX) - Abnormal; Notable for the following components:   Bacteria, UA FEW (*)    All other components within normal limits  LIPASE, BLOOD    EKG None  Radiology Dg Chest 2 View  Result Date: 05/28/2018 CLINICAL DATA:  Shortness of breath and rib pain. EXAM: CHEST - 2 VIEW COMPARISON:  11/02/2012 FINDINGS: The heart size and mediastinal contours are within normal limits. Both lungs are clear. The visualized skeletal structures are unremarkable. IMPRESSION: No active cardiopulmonary disease. Electronically Signed   By: Signa Kell M.D.   On: 05/28/2018 12:16    Procedures Procedures (including critical care time)  Medications Ordered in ED Medications  gi cocktail (Maalox,Lidocaine,Donnatal) (30 mLs Oral Given 05/28/18 1327)     Initial Impression / Assessment and Plan /  ED Course  I have reviewed the triage vital signs and the nursing notes.  Pertinent labs & imaging results that were available during my care of the patient were reviewed by me and considered in my medical decision making (see chart for details).     Patient presents today for evaluation of epigastric abdominal pain that is been intermittent for the past year, is currently radiating around to her back.  She has previously been diagnosed with chronic back pain, costochondritis.  Discussed with patient work-up options, chest x-ray without acute abnormalities, as she initially complained of shortness of breath, however upon discussions with patient it is that it hurts when she breathes, not that she is truly short of breath.  PERC negative, EKG without acute abnormalities.  Pain is reproducible with palpation over epigastric area, slight pain in bilateral upper quadrants also.  Patient was treated with GI cocktail which provided significant relief.  Discussed with patient role of CT scan, and patient declined.  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  She does not have any urinary symptoms, therefore will not treat as UTI.  Conservative measures for GERD discussed with patient, along with dietary changes, prescriptions for Prilosec, and Carafate given.  Patient is to follow-up with her GI and primary care doctor.  Patient discharged home.  Final Clinical Impressions(s) / ED Diagnoses   Final diagnoses:  Epigastric pain    ED Discharge Orders        Ordered    sucralfate (CARAFATE) 1 g tablet  3 times daily with meals & bedtime     05/28/18 1446    omeprazole (PRILOSEC) 20 MG capsule  Daily     05/28/18 1446       Cristina Gong, Cordelia Poche 05/28/18 1458    Benjiman Core, MD 05/28/18 [phone]

## 2018-05-28 NOTE — ED Triage Notes (Signed)
C/o SOB, bilat rib pain x "a year"-also c/o prod cough "I don't even know how long"-NAD-steady gait

## 2020-03-24 NOTE — Progress Notes (Signed)
CARDIOLOGY OFFICE NOTE  Date:  03/25/2020    Teresa Ochoa Date of Birth: November 25, 1980 Medical Record #[id number]  PCP:  Jacqualine Mau, NP  Cardiologist:  Tyrone Sage   Chief Complaint  Patient presents with  . Chest Pain    New patient visit    History of Present Illness: Teresa Ochoa is a 39 y.o. female who presents today for a new patient visit. I see her mother in law - Teresa Ochoa - long term patient of mine for over 20+ years.   She has a history of ongoing tobacco abuse, gastroparesis, MS, enlarged liver, migraines, and prior palpitations. She had remote drug addiction about 11 years ago - went to rehab and has since been clean - on Subaxone.   Has had recent visit to the ER for chest pain - was asked to follow with cardiology. Her mother in law wanted her to see me. EKG with questionable septal Q's.   The patient does not have symptoms concerning for COVID-19 infection (fever, chills, cough, or new shortness of breath).   Comes in today. Here alone. Teresa Ochoa has history of prior drug addiction - on chronic Subaxone. She has smoked since she was a teen. Typically a pack a day - recently tried to cut back to 1/2 pack a day. Does not really know her parents medical history - was raised by her grandparents - grandmother had numerous heart issues at an early age - died at 75 - grandfather died at 2. She has 3 children, a foster child and a grandchild. 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   She notes that she tends to "be sickly" - will get every cough/cold/stomach upset, etc. She is pretty sedentary with her job. Does not exercise. No real shortness of breath unless she really overexerts. She does cough - attributes to her smoking. She notes the onset of chest pain and shortness of breath last Thursday around 4PM. Felt like "something was stuck" in her chest - she also felt very exhausted and fatigued - more than her usual level of tiredness given the  business of her household. Friday morning - she still had the chest pain - went to her PCP - then sent to the ER because she was told she had had a heart attack at some point in the past due to abnormal EKG (septal Q's). Her ER evaluation was negative. She had a d dimer that was .61 and then .5 - she did not have a CT. Her pain resolved without any intervention. It has not returned. She admits she is anxious and her anxiety worsened when she was told about her EKG - thus wanting to be seen here.   Past Medical History:  Diagnosis Date  . Depression    Reports more depression since the accident   . Enlarged liver   . Gastroparesis   . Heart abnormality 08/03/2016   place on Heart monitor for Afib  . Migraine   . Migraines 01/21/2016  . MS (multiple sclerosis) (HCC)   . Substance abuse [facility])     Past Surgical History:  Procedure Laterality Date  . ABDOMINAL HYSTERECTOMY    . APPENDECTOMY    . GALLBLADDER SURGERY    . JOINT REPLACEMENT    . SALIVARY GLAND SURGERY    . TUBAL LIGATION       Medications: Current Meds  Medication Sig  . Buprenorphine HCl-Naloxone HCl (SUBOXONE SL) Place under the  tongue 2 (two) times daily.  Marland Kitchen SYMPROIC 0.2 MG TABS Take 1 tablet by mouth daily.  . [DISCONTINUED] Multiple Vitamins-Minerals (THRIVE FOR LIFE WOMENS PO) Take by mouth.  . [DISCONTINUED] sucralfate (CARAFATE) 1 g tablet Take 1 tablet (1 g total) by mouth 4 (four) times daily -  with meals and at bedtime for 10 days.     Allergies: Allergies  Allergen Reactions  . Ketorolac Anaphylaxis  . Macrobid [Nitrofurantoin Monohyd Macro] Hives and Swelling  . Other Hives, Rash and Swelling  . Reglan [Metoclopramide]      Makes my heart beat fast  . Tylenol [Acetaminophen]     Enlarged Liver  . Compazine Anxiety    Social History: The patient  reports that she has been smoking. She has been smoking about 1.00 pack per day. She has never used smokeless tobacco. She reports current alcohol use.  She reports that she does not use drugs.   Family History: The patient's family history includes Diabetes in her maternal grandmother; Heart disease in her maternal grandmother; Hypertension in her maternal grandmother.   Review of Systems: Please see the history of present illness.   All other systems are reviewed and negative.   Physical Exam: VS:  BP 118/80   Pulse 80   Ht 5\' 3"  (1.6 m)   Wt 199 lb (90.3 kg)   BMI 35.25 kg/m  .  BMI Body mass index is 35.25 kg/m.  Wt Readings from Last 3 Encounters:  03/25/20 199 lb (90.3 kg)  05/28/18 197 lb (89.4 kg)  06/27/15 196 lb (88.9 kg)    General: Pleasant. Alert and in no acute distress.  She is obese.  Cardiac: Regular rate and rhythm. No murmurs, rubs, or gallops. No edema.  Respiratory:  Lungs are clear to auscultation bilaterally with normal work of breathing.  GI: Soft and nontender.  MS: No deformity or atrophy. Gait and ROM intact.  Skin: Warm and dry. Color is normal.  Neuro:  Strength and sensation are intact and no gross focal deficits noted.  Psych: Alert, appropriate and with normal affect.   LABORATORY DATA:  EKG:  EKG is ordered today.  Personally reviewed by me. This demonstrates NSR with septal Q's.   Lab Results  Component Value Date   WBC 12.1 (H) 05/28/2018   HGB 14.3 05/28/2018   HCT 41.8 05/28/2018   PLT 280 05/28/2018   GLUCOSE 134 (H) 05/28/2018   ALT 15 05/28/2018   AST 21 05/28/2018   NA 139 05/28/2018   K 4.0 05/28/2018   CL 105 05/28/2018   CREATININE 0.63 05/28/2018   BUN 12 05/28/2018   CO2 26 05/28/2018     BNP (last 3 results) No results for input(s): BNP in the last 8760 hours.  ProBNP (last 3 results) No results for input(s): PROBNP in the last 8760 hours.   Other Studies Reviewed Today:   Assessment/Plan:  1. Chest pain - with multiple CV risk factors that include +FH, HLD and ongoing tobacco abuse. Her EKG has been noted to be abnormal - but this is not a new finding -  this dates back to 2013 on prior tracings. Needs CV risk factor modification. Will arrange for coronary CT with FFR. Further disposition to follow.   BUN was 8 with creatinine of 0.65 on 03/21/2019  2. Tobacco abuse - total cessation is advised - discussed at length. She does seem motivated to make changes.   3. Substance abuse - drug use resolved.  4. HLD - lipids from 07/2019 with TC of 213, TG 244, HDL 31 and LDL 138. Would advise statin based on her CT results.   5. COVID-19 Education: The signs and symptoms of COVID-19 were discussed with the patient and how to seek care for testing (follow up with PCP or arrange E-visit).  The importance of social distancing, staying at home, hand hygiene and wearing a mask when out in public were discussed today.  Current medicines are reviewed with the patient today.  The patient does not have concerns regarding medicines other than what has been noted above.  The following changes have been made:  See above.  Labs/ tests ordered today include:    Orders Placed This Encounter  Procedures  . CT CORONARY MORPH W/CTA COR W/SCORE W/CA W/CM &/OR WO/CM  . CT CORONARY FRACTIONAL FLOW RESERVE DATA PREP  . CT CORONARY FRACTIONAL FLOW RESERVE FLUID ANALYSIS  . EKG 12-Lead     Disposition:   Further disposition pending.   Patient is agreeable to this plan and will call if any problems develop in the interim.   SignedNorma Fredrickson, NP  03/25/2020 5:51 PM  [facility] [address] [place], [place]  [address] Phone: [phone] Fax: [phone]

## 2020-03-25 ENCOUNTER — Other Ambulatory Visit: Payer: Self-pay

## 2020-03-25 ENCOUNTER — Ambulatory Visit: Payer: 59 | Admitting: Nurse Practitioner

## 2020-03-25 ENCOUNTER — Encounter: Payer: Self-pay | Admitting: Nurse Practitioner

## 2020-03-25 VITALS — BP 118/80 | HR 80 | Ht 63.0 in | Wt 199.0 lb

## 2020-03-25 DIAGNOSIS — R079 Chest pain, unspecified: Secondary | ICD-10-CM | POA: Diagnosis not present

## 2020-03-25 DIAGNOSIS — R9431 Abnormal electrocardiogram [ECG] [EKG]: Secondary | ICD-10-CM | POA: Diagnosis not present

## 2020-03-25 DIAGNOSIS — I259 Chronic ischemic heart disease, unspecified: Secondary | ICD-10-CM | POA: Diagnosis not present

## 2020-03-25 MED ORDER — METOPROLOL TARTRATE 100 MG PO TABS: 100.0000 mg | ORAL_TABLET | ORAL | 0 refills | Status: DC

## 2020-03-25 NOTE — Patient Instructions (Signed)
After Visit Summary:  We will be checking the following labs today - NONE   Medication Instructions:    Continue with your current medicines.    If you need a refill on your cardiac medications before your next appointment, please call your pharmacy.     Testing/Procedures To Be Arranged:  Coronary CT  Follow-Up:   We will see what your CT shows and then decide about follow up    At [facility], you and your health needs are our priority.  As part of our continuing mission to provide you with exceptional heart care, we have created designated Provider Care Teams.  These Care Teams include your primary Cardiologist (physician) and Advanced Practice Providers (APPs -  Physician Assistants and Nurse Practitioners) who all work together to provide you with the care you need, when you need it.  Special Instructions:  . Stay safe, stay home, wash your hands for at least 20 seconds and wear a mask when out in public.  . It was good to talk with you today.     Your cardiac CT will be scheduled at:   [facility] [address] [place], [place] [address] [phone]  If scheduled at [facility], please arrive at the [facility] main entrance of [facility] 30 minutes prior to test start time. Proceed to the [facility] Radiology Department (first floor) to check-in and test prep.   Please follow these instructions carefully (unless otherwise directed):  On the Night Before the Test: . Be sure to Drink plenty of water. . Do not consume any caffeinated/decaffeinated beverages or chocolate 12 hours prior to your test. . Do not take any antihistamines 12 hours prior to your test.   On the Day of the Test: . Drink plenty of water. Do not drink any water within one hour of the test. . Do not eat any food 4 hours prior to the test. . You may take your regular medications prior to the test.  . Take metoprolol (Lopressor) two hours prior to  test.  . FEMALES- please wear underwire-free bra if available        After the Test: . Drink plenty of water. . After receiving IV contrast, you may experience a mild flushed feeling. This is normal. . On occasion, you may experience a mild rash up to 24 hours after the test. This is not dangerous. If this occurs, you can take Benadryl 25 mg and increase your fluid intake. . If you experience trouble breathing, this can be serious. If it is severe call 911 IMMEDIATELY. If it is mild, please call our office.    Once we have confirmed authorization from your insurance company, we will call you to set up a date and time for your test.   For non-scheduling related questions, please contact the cardiac imaging nurse navigator should you have any questions/concerns: Rockwell Alexandria, RN Navigator Cardiac Imaging [facility] [phone] office  For scheduling needs, including cancellations and rescheduling, please call [phone].      Call the [facility] office at [phone] if you have any questions, problems or concerns.

## 2020-04-13 ENCOUNTER — Telehealth (HOSPITAL_COMMUNITY): Payer: Self-pay | Admitting: *Deleted

## 2020-04-13 NOTE — Telephone Encounter (Signed)
Attempted to call patient regarding upcoming cardiac CT appointment. Left message on voicemail with name and callback number  Sanjit Mcmichael Tai RN Navigator Cardiac Imaging [facility] [phone] Office [phone] Cell  

## 2020-04-14 ENCOUNTER — Other Ambulatory Visit: Payer: Self-pay

## 2020-04-14 ENCOUNTER — Ambulatory Visit (HOSPITAL_COMMUNITY)
Admission: RE | Admit: 2020-04-14 | Discharge: 2020-04-14 | Disposition: A | Discharge status: Home / Self Care | Payer: 59 | Source: Ambulatory Visit | Attending: Nurse Practitioner | Admitting: Nurse Practitioner

## 2020-04-14 DIAGNOSIS — R9431 Abnormal electrocardiogram [ECG] [EKG]: Secondary | ICD-10-CM | POA: Diagnosis not present

## 2020-04-14 DIAGNOSIS — R079 Chest pain, unspecified: Secondary | ICD-10-CM

## 2020-04-14 DIAGNOSIS — I259 Chronic ischemic heart disease, unspecified: Secondary | ICD-10-CM | POA: Diagnosis present

## 2020-04-14 MED ORDER — NITROGLYCERIN 0.4 MG SL SUBL
SUBLINGUAL_TABLET | SUBLINGUAL | Status: AC
  Filled 2020-04-14: qty 2

## 2020-04-14 MED ORDER — NITROGLYCERIN 0.4 MG SL SUBL
0.8000 mg | SUBLINGUAL_TABLET | Freq: Once | SUBLINGUAL | Status: AC
  Administered 2020-04-14: 0.8 mg via SUBLINGUAL

## 2020-04-14 MED ORDER — IOHEXOL 350 MG/ML SOLN
80.0000 mL | Freq: Once | INTRAVENOUS | Status: AC | PRN
  Administered 2020-04-14: 80 mL via INTRAVENOUS

## 2020-04-14 NOTE — Progress Notes (Signed)
On arrival to [facility] asked if the Metoprolol she took could make her feel so tired and weird. Let her know it could. After nitro had headache and dizzy, given Coke and snack and dizziness less and headache gone but still feels very tired and weird as she did on arrival.  VS stable. Has a ride home. Discharged in w/c.

## 2021-02-19 ENCOUNTER — Other Ambulatory Visit: Payer: Self-pay

## 2021-02-19 ENCOUNTER — Emergency Department (HOSPITAL_BASED_OUTPATIENT_CLINIC_OR_DEPARTMENT_OTHER): Payer: Managed Care, Other (non HMO)

## 2021-02-19 ENCOUNTER — Encounter (HOSPITAL_BASED_OUTPATIENT_CLINIC_OR_DEPARTMENT_OTHER): Payer: Self-pay | Admitting: *Deleted

## 2021-02-19 ENCOUNTER — Emergency Department (HOSPITAL_BASED_OUTPATIENT_CLINIC_OR_DEPARTMENT_OTHER)
Admission: EM | Admit: 2021-02-19 | Discharge: 2021-02-19 | Disposition: A | Discharge status: Home / Self Care | Payer: Managed Care, Other (non HMO) | Attending: Emergency Medicine | Admitting: Emergency Medicine

## 2021-02-19 DIAGNOSIS — R002 Palpitations: Secondary | ICD-10-CM | POA: Insufficient documentation

## 2021-02-19 DIAGNOSIS — R0789 Other chest pain: Secondary | ICD-10-CM | POA: Insufficient documentation

## 2021-02-19 DIAGNOSIS — R079 Chest pain, unspecified: Secondary | ICD-10-CM

## 2021-02-19 DIAGNOSIS — R519 Headache, unspecified: Secondary | ICD-10-CM | POA: Insufficient documentation

## 2021-02-19 DIAGNOSIS — F17210 Nicotine dependence, cigarettes, uncomplicated: Secondary | ICD-10-CM | POA: Diagnosis not present

## 2021-02-19 DIAGNOSIS — Z9660 Presence of unspecified orthopedic joint implant: Secondary | ICD-10-CM | POA: Insufficient documentation

## 2021-02-19 LAB — BASIC METABOLIC PANEL
Anion gap: 10 (ref 5–15)
BUN: 10 mg/dL (ref 6–20)
CO2: 25 mmol/L (ref 22–32)
Calcium: 9.1 mg/dL (ref 8.9–10.3)
Chloride: 102 mmol/L (ref 98–111)
Creatinine, Ser: 0.59 mg/dL (ref 0.44–1.00)
GFR, Estimated: >60 mL/min (ref >60)
Glucose, Bld: 100 mg/dL — ABNORMAL HIGH (ref 70–99)
Potassium: 4 mmol/L (ref 3.5–5.1)
Sodium: 137 mmol/L (ref 135–145)

## 2021-02-19 LAB — D-DIMER, QUANTITATIVE: D-Dimer, Quant: 0.27 ug/mL-FEU (ref 0.00–0.50)

## 2021-02-19 LAB — CBC
HCT: 41.7 % (ref 36.0–46.0)
Hemoglobin: 14.1 g/dL (ref 12.0–15.0)
MCH: 29.9 pg (ref 26.0–34.0)
MCHC: 33.8 g/dL (ref 30.0–36.0)
MCV: 88.5 fL (ref 80.0–100.0)
Platelets: 289 10*3/uL (ref 150–400)
RBC: 4.71 MIL/uL (ref 3.87–5.11)
RDW: 13.5 % (ref 11.5–15.5)
WBC: 13.1 10*3/uL — ABNORMAL HIGH (ref 4.0–10.5)
nRBC: 0 % (ref 0.0–0.2)

## 2021-02-19 LAB — PREGNANCY, URINE: Preg Test, Ur: NEGATIVE

## 2021-02-19 LAB — TROPONIN I (HIGH SENSITIVITY)
Troponin I (High Sensitivity): 2 ng/L (ref <18)
Troponin I (High Sensitivity): 2 ng/L (ref <18)

## 2021-02-19 LAB — CBG MONITORING, ED: Glucose-Capillary: 93 mg/dL (ref 70–99)

## 2021-02-19 MED ORDER — ASPIRIN 81 MG PO CHEW: 324.0000 mg | CHEWABLE_TABLET | Freq: Once | ORAL | Status: DC

## 2021-02-19 MED ORDER — MORPHINE SULFATE (PF) 4 MG/ML IV SOLN: 4.0000 mg | Freq: Once | INTRAVENOUS | Status: DC

## 2021-02-19 MED ORDER — ONDANSETRON HCL 4 MG/2ML IJ SOLN
4.0000 mg | Freq: Once | INTRAMUSCULAR | Status: AC
  Administered 2021-02-19: 4 mg via INTRAVENOUS
  Filled 2021-02-19 (×2): qty 2

## 2021-02-19 MED ORDER — MORPHINE SULFATE (PF) 4 MG/ML IV SOLN
4.0000 mg | Freq: Once | INTRAVENOUS | Status: AC
  Administered 2021-02-19: 4 mg via INTRAVENOUS
  Filled 2021-02-19 (×2): qty 1

## 2021-02-19 NOTE — ED Provider Notes (Signed)
[facility] EMERGENCY DEPARTMENT Provider Note   CSN: [id number] Arrival date & time: 02/19/21  1806     History Chief complaint: Chest pain, headache  Teresa Ochoa Harle Battiest is a 40 y.o. female.  HPI   Patient states she was at home this evening when she suddenly felt her heart racing.  She felt like it was going to come out of her chest.  She then started to experiencing a severe headache as well as pain going into her left arm.  Patient states that her symptoms have started to improve since that time.  She has had some intermittent chest discomfort over the past week.  Patient does not have any history of heart disease.  She does not have any numbness or weakness right now.  She is not had any fevers or chills.  No neck stiffness at this time.  Past Medical History:  Diagnosis Date  . Depression    Reports more depression since the accident   . Enlarged liver   . Gastroparesis   . Heart abnormality 08/03/2016   place on Heart monitor for Afib  . Migraine   . Migraines 01/21/2016  . MS (multiple sclerosis) (HCC)   . Substance abuse [facility])     Patient Active Problem List   Diagnosis Date Noted  . Chronic leg pain 03/31/2016  . Chronic back pain 03/31/2016  . Migraines 01/21/2016  . Enlarged liver     Past Surgical History:  Procedure Laterality Date  . ABDOMINAL HYSTERECTOMY    . APPENDECTOMY    . GALLBLADDER SURGERY    . JOINT REPLACEMENT    . SALIVARY GLAND SURGERY    . TUBAL LIGATION       OB History   No obstetric history on file.     Family History  Problem Relation Age of Onset  . Heart disease Maternal Grandmother   . Diabetes Maternal Grandmother   . Hypertension Maternal Grandmother     Social History   Tobacco Use  . Smoking status: Current Every Day Smoker    Packs/day: 1.00  . Smokeless tobacco: Never Used  Substance Use Topics  . Alcohol use: Yes    Comment: occ  . Drug use: No    Home Medications Prior to Admission medications    Medication Sig Start Date End Date Taking? Authorizing Provider  buprenorphine (SUBUTEX) 8 MG SUBL SL tablet Place under the tongue. 02/03/21 03/05/21 Yes [provider]  Buprenorphine HCl-Naloxone HCl (SUBOXONE SL) Place under the tongue 2 (two) times daily.    [provider]  metoprolol tartrate (LOPRESSOR) 100 MG tablet Take 1 tablet (100 mg total) by mouth as directed. Take 2 hours prior to CT scan. 03/25/20 06/23/20  Rosalio Macadamia, NP  SYMPROIC 0.2 MG TABS Take 1 tablet by mouth daily. 03/20/20   [provider]    Allergies    Ketorolac, Macrobid [nitrofurantoin monohyd macro], Other, Reglan [metoclopramide], Tylenol [acetaminophen], and Compazine  Review of Systems   Review of Systems  All other systems reviewed and are negative.   Physical Exam Updated Vital Signs BP 125/70   Pulse 79   Temp 98.2 F (36.8 C) (Oral)   Resp 18   Ht 1.6 m (5\' 3" )   Wt 84.6 kg   SpO2 99%   BMI 33.05 kg/m   Physical Exam Vitals and nursing note reviewed.  Constitutional:      General: She is not in acute distress.    Appearance: She is  well-developed.  HENT:     Head: Normocephalic and atraumatic.     Right Ear: External ear normal.     Left Ear: External ear normal.  Eyes:     General: No scleral icterus.       Right eye: No discharge.        Left eye: No discharge.     Conjunctiva/sclera: Conjunctivae normal.  Neck:     Trachea: No tracheal deviation.  Cardiovascular:     Rate and Rhythm: Normal rate and regular rhythm.  Pulmonary:     Effort: Pulmonary effort is normal. No respiratory distress.     Breath sounds: Normal breath sounds. No stridor. No wheezing or rales.  Abdominal:     General: Bowel sounds are normal. There is no distension.     Palpations: Abdomen is soft.     Tenderness: There is no abdominal tenderness. There is no guarding or rebound.  Musculoskeletal:        General: No tenderness.     Cervical back: Neck supple.  Skin:     General: Skin is warm and dry.     Findings: No rash.  Neurological:     Mental Status: She is alert and oriented to person, place, and time.     Cranial Nerves: No cranial nerve deficit (No facial droop, extraocular movements intact, tongue midline ).     Sensory: No sensory deficit.     Motor: No abnormal muscle tone or seizure activity.     Coordination: Coordination normal.     Comments: No pronator drift bilateral upper extrem, able to hold both legs off bed for 5 seconds, sensation intact in all extremities, no visual field cuts, no left or right sided neglect, normal finger-nose exam bilaterally, no nystagmus noted      ED Results / Procedures / Treatments   Labs (all labs ordered are listed, but only abnormal results are displayed) Labs Reviewed  BASIC METABOLIC PANEL - Abnormal; Notable for the following components:      Result Value   Glucose, Bld 100 (*)    All other components within normal limits  CBC - Abnormal; Notable for the following components:   WBC 13.1 (*)    All other components within normal limits  PREGNANCY, URINE  D-DIMER, QUANTITATIVE  CBG MONITORING, ED  TROPONIN I (HIGH SENSITIVITY)  TROPONIN I (HIGH SENSITIVITY)    EKG EKG Interpretation  Date/Time:  Friday February 19 2021 18:22:54 EDT Ventricular Rate:  85 PR Interval:  157 QRS Duration: 97 QT Interval:  388 QTC Calculation: 462 R Axis:   12 Text Interpretation: Sinus rhythm No significant change since last tracing Confirmed by Linwood Dibbles [phone]) on 02/19/2021 6:26:24 PM   Radiology CT Head Wo Contrast  Result Date: 02/19/2021 CLINICAL DATA:  Headache EXAM: CT HEAD WITHOUT CONTRAST TECHNIQUE: Contiguous axial images were obtained from the base of the skull through the vertex without intravenous contrast. COMPARISON:  03/06/2015 FINDINGS: Brain: No acute intracranial abnormality. Specifically, no hemorrhage, hydrocephalus, mass lesion, acute infarction, or significant intracranial injury.  Vascular: No hyperdense vessel or unexpected calcification. Skull: No acute calvarial abnormality. Sinuses/Orbits: Visualized paranasal sinuses and mastoids clear. Orbital soft tissues unremarkable. Other: None IMPRESSION: Normal study. Electronically Signed   By: Charlett Nose M.D.   On: 02/19/2021 19:11   DG Chest Portable 1 View  Result Date: 02/19/2021 CLINICAL DATA:  Chest pain, headache EXAM: PORTABLE CHEST 1 VIEW COMPARISON:  05/28/2018 FINDINGS: The heart size and mediastinal contours are  within normal limits. Both lungs are clear. The visualized skeletal structures are unremarkable. IMPRESSION: Negative. Electronically Signed   By: Charlett Nose M.D.   On: 02/19/2021 19:10    Procedures Procedures   Medications Ordered in ED Medications  morphine 4 MG/ML injection 4 mg (has no administration in time range)  ondansetron (ZOFRAN) injection 4 mg (4 mg Intravenous Given 02/19/21 1918)  morphine 4 MG/ML injection 4 mg (4 mg Intravenous Given 02/19/21 1918)    ED Course  I have reviewed the triage vital signs and the nursing notes.  Pertinent labs & imaging results that were available during my care of the patient were reviewed by me and considered in my medical decision making (see chart for details).  Clinical Course as of 02/19/21 2137  Fri Feb 19, 2021  5643 Patient is on chronic buprenorphine.  Last prescription March 16 [JK]  2005 Metabolic panel normal.  D-dimer and first troponin is normal.  CBC is normal [JK]  2005 Head CT and chest x-ray without acute findings.  (note, no contrasted studies available due to CT malfunction) [JK]    Clinical Course User Index [JK] Linwood Dibbles, MD   MDM Rules/Calculators/A&P                          Patient presented to ED with complaints of palpitations chest pain and headache.  Was concerned about the possibility of acute hemorrhage.  CT scan fortunately does not show any abnormalities.  Patient does not have any murmur noted on carotid exam.   She has no focal neurologic symptoms.  I doubt carotid artery dissection.  I do not have the ability to contrasted CT scans this evening but I do not have high suspicion at this time that I feel the patient needs to be transferred for further evaluation.  Serial cardiac enzymes were normal.  Patient did have palpitations earlier and states she does have history of paroxysmal A. fib.  Unclear if that is what happened earlier but at this time she is having normal heart rhythm and has remained stable during the several hours she has been in the ED.  Doubt ACS PE or dissection.  We will have her continue her home medication regimen.  Follow-up with her primary care doctor. Final Clinical Impression(s) / ED Diagnoses Final diagnoses:  Chest pain, unspecified type  Nonintractable headache, unspecified chronicity pattern, unspecified headache type    Rx / DC Orders ED Discharge Orders    None       Linwood Dibbles, MD 02/19/21 2137

## 2021-02-19 NOTE — ED Triage Notes (Signed)
She is tearful at triage. Pain in her upper chest on and off for a week. She feels like her heart is going to stop beating. Pain in her head and left arm when the pain starts. EMS was called by her husband. She refused transport.

## 2021-02-19 NOTE — Discharge Instructions (Addendum)
Continue your current medications.  Follow-up with your doctor next week to be rechecked.  Return to the ED as needed for worsening symptoms

## 2021-02-24 ENCOUNTER — Encounter: Payer: Self-pay | Admitting: Internal Medicine

## 2021-02-24 ENCOUNTER — Other Ambulatory Visit: Payer: Self-pay

## 2021-02-24 ENCOUNTER — Encounter: Payer: Self-pay | Admitting: *Deleted

## 2021-02-24 ENCOUNTER — Ambulatory Visit (INDEPENDENT_AMBULATORY_CARE_PROVIDER_SITE_OTHER): Payer: Managed Care, Other (non HMO) | Admitting: Internal Medicine

## 2021-02-24 VITALS — BP 120/84 | HR 82 | Ht 64.0 in | Wt 185.0 lb

## 2021-02-24 DIAGNOSIS — E7801 Familial hypercholesterolemia: Secondary | ICD-10-CM | POA: Diagnosis not present

## 2021-02-24 DIAGNOSIS — R55 Syncope and collapse: Secondary | ICD-10-CM | POA: Insufficient documentation

## 2021-02-24 DIAGNOSIS — Z8632 Personal history of gestational diabetes: Secondary | ICD-10-CM

## 2021-02-24 DIAGNOSIS — R079 Chest pain, unspecified: Secondary | ICD-10-CM | POA: Insufficient documentation

## 2021-02-24 DIAGNOSIS — R002 Palpitations: Secondary | ICD-10-CM | POA: Insufficient documentation

## 2021-02-24 LAB — LIPID PANEL
Chol/HDL Ratio: 6 ratio — ABNORMAL HIGH (ref 0.0–4.4)
Cholesterol, Total: 241 mg/dL — ABNORMAL HIGH (ref 100–199)
HDL: 40 mg/dL (ref >39)
LDL Chol Calc (NIH): 154 mg/dL — ABNORMAL HIGH (ref 0–99)
Triglycerides: 254 mg/dL — ABNORMAL HIGH (ref 0–149)
VLDL Cholesterol Cal: 47 mg/dL — ABNORMAL HIGH (ref 5–40)

## 2021-02-24 MED ORDER — DILTIAZEM HCL 30 MG PO TABS: ORAL_TABLET | ORAL | 1 refills | Status: DC

## 2021-02-24 NOTE — Patient Instructions (Addendum)
Medication Instructions:  Your physician has recommended you make the following change in your medication:  START: diltiazem (Cardizem) 30 mg every 6 hours as needed for heart palpitations  *If you need a refill on your cardiac medications before your next appointment, please call your pharmacy*   Lab Work: TODAY: FLP If you have labs (blood work) drawn today and your tests are completely normal, you will receive your results only by: Marland Kitchen MyChart Message (if you have MyChart) OR . A paper copy in the mail If you have any lab test that is abnormal or we need to change your treatment, we will call you to review the results.   Testing/Procedures: Your physician has requested that you have a stress echocardiogram.  Please follow instruction sheet as given.  You will have to get a COVID test.   Your physician has requested that you have an echocardiogram. Echocardiography is a painless test that uses sound waves to create images of your heart. It provides your doctor with information about the size and shape of your heart and how well your heart's chambers and valves are working. This procedure takes approximately one hour. There are no restrictions for this procedure.  Your physician has requested that you wear a heart monitor.      Follow-Up: At [facility], you and your health needs are our priority.  As part of our continuing mission to provide you with exceptional heart care, we have created designated Provider Care Teams.  These Care Teams include your primary Cardiologist (physician) and Advanced Practice Providers (APPs -  Physician Assistants and Nurse Practitioners) who all work together to provide you with the care you need, when you need it.  We recommend signing up for the patient portal called "MyChart".  Sign up information is provided on this After Visit Summary.  MyChart is used to connect with patients for Virtual Visits (Telemedicine).  Patients are able to view lab/test  results, encounter notes, upcoming appointments, etc.  Non-urgent messages can be sent to your provider as well.   To learn more about what you can do with MyChart, go to [email or web address].    Your next appointment:   5-6 month(s)  The format for your next appointment:   In Person  Provider:   You may see Christell Constant, MD or one of the following Advanced Practice Providers on your designated Care Team:    Ronie Spies, PA-C  Jacolyn Reedy, PA-C    Other Instructions [employer] Cardiac Event Monitor Instructions Your physician has requested you wear your cardiac event monitor for __30___ days. [employer] may call or text to confirm a shipping address. The monitor will be sent to a land address via UPS. [employer] will not ship a monitor to a PO BOX. It typically takes 3-5 days to receive your monitor after it has been enrolled. [employer] will assist with USPS tracking if your package is delayed. The telephone number for [employer] is [phone]. Once you have received your monitor, please review the enclosed instructions. Instruction tutorials can also be viewed under help and settings on the enclosed cell phone. Your monitor has already been registered assigning a specific monitor serial # to you.  Applying the monitor Remove cell phone from case and turn it on. The cell phone works as IT consultant and needs to be within [employer] of you at all times. The cell phone will need to be charged on a daily basis. We recommend you plug the cell phone into the enclosed  charger at your bedside table every night.  Monitor batteries: You will receive two monitor batteries labelled #1 and #2. These are your recorders. Plug battery #2 onto the second connection on the enclosed charger. Keep one battery on the charger at all times. This will keep the monitor battery deactivated. It will also keep it fully charged for when you need to switch your monitor batteries. A small  light will be blinking on the battery emblem when it is charging. The light on the battery emblem will remain on when the battery is fully charged.  Open package of a Monitor strip. Insert battery #1 into black hood on strip and gently squeeze monitor battery onto connection as indicated in instruction booklet. Set aside while preparing skin.  Choose location for your strip, vertical or horizontal, as indicated in the instruction booklet. Shave to remove all hair from location. There cannot be any lotions, oils, powders, or colognes on skin where monitor is to be applied. Wipe skin clean with enclosed Saline wipe. Dry skin completely.  Peel paper labeled #1 off the back of the Monitor strip exposing the adhesive. Place the monitor on the chest in the vertical or horizontal position shown in the instruction booklet. One arrow on the monitor strip must be pointing upward. Carefully remove paper labeled #2, attaching remainder of strip to your skin. Try not to create any folds or wrinkles in the strip as you apply it.  Firmly press and release the circle in the center of the monitor battery. You will hear a small beep. This is turning the monitor battery on. The heart emblem on the monitor battery will light up every 5 seconds if the monitor battery in turned on and connected to the patient securely. Do not push and hold the circle down as this turns the monitor battery off. The cell phone will locate the monitor battery. A screen will appear on the cell phone checking the connection of your monitor strip. This may read poor connection initially but change to good connection within the next minute. Once your monitor accepts the connection you will hear a series of 3 beeps followed by a climbing crescendo of beeps. A screen will appear on the cell phone showing the two monitor strip placement options. Touch the picture that demonstrates where you applied the monitor strip.  Your monitor strip and  battery are waterproof. You are able to shower, bathe, or swim with the monitor on. They just ask you do not submerge deeper than 3 feet underwater. We recommend removing the monitor if you are swimming in a lake, river, or ocean.  Your monitor battery will need to be switched to a fully charged monitor battery approximately once a week. The cell phone will alert you of an action which needs to be made.  On the cell phone, tap for details to reveal connection status, monitor battery status, and cell phone battery status. The green dots indicates your monitor is in good status. A red dot indicates there is something that needs your attention.  To record a symptom, click the circle on the monitor battery. In 30-60 seconds a list of symptoms will appear on the cell phone. Select your symptom and tap save. Your monitor will record a sustained or significant arrhythmia regardless of you clicking the button. Some patients do not feel the heart rhythm irregularities. [employer] will notify us of any serious or critical events.  Refer to instruction booklet for instructions on switching batteries, changing strips, the  Do not disturb or Pause features, or any additional questions.  Call [employer] at [phone], to confirm your monitor is transmitting and record your baseline. They will answer any questions you may have regarding the monitor instructions at that time.  Returning the monitor to [employer] Place all equipment back into blue box. Peel off strip of paper to expose adhesive and close box securely. There is a prepaid UPS shipping label on this box. Drop in a UPS drop box, or at a UPS facility like [employer]. You may also contact [employer] to arrange UPS to pick up monitor package at your home.

## 2021-02-24 NOTE — Progress Notes (Signed)
Patient ID: Teresa Ochoa, female   DOB: 05-29-1981, 40 y.o.   MRN: [id number] Patient enrolled for [employer] to ship a 30 day cardiac event monitor to her home.

## 2021-02-24 NOTE — Progress Notes (Signed)
Cardiology Office Note:    Date:  02/24/2021   ID:  Teresa Ochoa, DOB Jul 15, 1981, MRN [id number]  PCP:  Vivien Presto, MD    [facility]  Cardiologist:  Christell Constant, MD  Advanced Practice Provider:  No care team member to display Electrophysiologist:  None       CC: Follow up CP  History of Present Illness:    Teresa Ochoa is a 40 y.o. female with a hx of Former Tobacco Abuse, Familial Hyperlipidemia, gastroparesis,remote polysubstance absuse hx of suboxone, who presents 02/24/21.  In interim of this visit, patient had new racing heart beats while at work.  Went to ED.  Also has chest pain that radiates into her neck.  Patient notes that she is feeling tired and unwell since 02/19/21 . Has sudden onset tachycardia.  Lasted about three minutes  Felt chest tightness, headache, and thought she was going to die. Has had palpitations in the past:  Her heart starts beating really fast, but has never had associated with such a long duration (30-40s).  Outside of this, has chest pain that radiates into her neck.  Started about a month and a half ago.    Discomfort occurs sponatneously, worsens with anxiety:  Feels like a migraine on the left side of her body.  Patient exertion notable for working out a couple of times a week and feels fast heart rates.  No shortness of breath, DOE.  No PND or orthopnea.  No bendopnea, weight gain, leg swelling , or abdominal swelling.  Felt near syncope with her palpitations.   History of gestational DM with oldest son.  Prior drug use.   Past Medical History:  Diagnosis Date  . Depression    Reports more depression since the accident   . Enlarged liver   . Gastroparesis   . Heart abnormality 08/03/2016   place on Heart monitor for Afib  . Migraine   . Migraines 01/21/2016  . MS (multiple sclerosis) (HCC)   . Substance abuse [facility])     Past Surgical History:  Procedure Laterality Date  . ABDOMINAL HYSTERECTOMY     . APPENDECTOMY    . GALLBLADDER SURGERY    . JOINT REPLACEMENT    . SALIVARY GLAND SURGERY    . TUBAL LIGATION      Current Medications: Current Meds  Medication Sig  . buprenorphine (SUBUTEX) 8 MG SUBL SL tablet Place under the tongue.  . diltiazem (CARDIZEM) 30 MG tablet Take as needed for heart palpitations  . lactulose (CHRONULAC) 10 GM/15ML solution Take by mouth in the morning and at bedtime.  . montelukast (SINGULAIR) 10 MG tablet Take 1 tablet by mouth at bedtime.  . ondansetron (ZOFRAN-ODT) 4 MG disintegrating tablet Take 4 mg by mouth every 8 (eight) hours as needed.     Allergies:   Ketorolac, Macrobid [nitrofurantoin monohyd macro], Other, Reglan [metoclopramide], Tylenol [acetaminophen], and Compazine   Social History   Socioeconomic History  . Marital status: Married    Spouse name: Not on file  . Number of children: Not on file  . Years of education: Not on file  . Highest education level: Not on file  Occupational History  . Not on file  Tobacco Use  . Smoking status: Current Every Day Smoker    Packs/day: 1.00  . Smokeless tobacco: Never Used  Substance and Sexual Activity  . Alcohol use: Yes    Comment: occ  . Drug use: No  .  Sexual activity: Not on file  Other Topics Concern  . Not on file  Social History Narrative  . Not on file   Social Determinants of Health   Financial Resource Strain: Not on file  Food Insecurity: Not on file  Transportation Needs: Not on file  Physical Activity: Not on file  Stress: Not on file  Social Connections: Not on file     Family History: The patient's family history includes Diabetes in her maternal grandmother; Heart disease in her maternal grandmother; Hypertension in her maternal grandmother.  ROS:   Please see the history of present illness.     All other systems reviewed and are negative.  EKGs/Labs/Other Studies Reviewed:    The following studies were reviewed today:  EKG:   02/19/21: SR 81  WNL  Cardiac CT: Date: 04/14/20 Results: IMPRESSION: 1. No evidence of CAD, CADRADS = 0.  2. Coronary calcium score of 0. This was 0 percentile for age and sex matched control.  3. Normal coronary origin with right dominance.  4. Borderline dilated main pulmonary artery at 27 mm, suggestive of possible pulmonary hypertension.   Recent Labs: 02/19/2021: BUN 10; Creatinine, Ser 0.59; Hemoglobin 14.1; Platelets 289; Potassium 4.0; Sodium 137  Recent Lipid Panel No results found for: CHOL, TRIG, HDL, CHOLHDL, VLDL, LDLCALC, LDLDIRECT   Risk Assessment/Calculations:     NA  Physical Exam:    VS:  BP 120/84   Pulse 82   Ht 5\' 4"  (1.626 m)   Wt 185 lb (83.9 kg)   SpO2 97%   BMI 31.76 kg/m     Wt Readings from Last 3 Encounters:  02/24/21 185 lb (83.9 kg)  02/19/21 186 lb 9.6 oz (84.6 kg)  03/25/20 199 lb (90.3 kg)     GEN:  Well nourished, well developed in no acute distress HEENT: Normal NECK: No JVD; No carotid bruits LYMPHATICS: No lymphadenopathy CARDIAC: RRR, no murmurs, rubs, gallops RESPIRATORY:  Clear to auscultation without rales, wheezing or rhonchi  ABDOMEN: Soft, non-tender, non-distended MUSCULOSKELETAL:  No edema; No deformity  SKIN: Warm and dry NEUROLOGIC:  Alert and oriented x 3 PSYCHIATRIC:  Normal affect   ASSESSMENT:    1. Chest pain of uncertain etiology   2. Palpitations   3. Near syncope   4. Familial hypercholesterolemia   5. History of gestational diabetes    PLAN:    In order of problems listed above:  Chest Pain Syndrome Familial Hyperocholesterolemia Tobacco Abuse (recent quit) History of gestational DM New Onset Palpitations and near syncope. - The patient presents with cardiac pain and has some risk factors worsen since last evaluation - Additional Blood Work:  Lipids (fasting today) - Would recommend an echocardiogram and stress echocardiogram - will get 30 Day [employer] Monitor (given sx burden of 3-4 times a month,  concerned that 2 week monitor would be insufficient) - will get PRN diltiazem 30 mg PO q6hr PRN Palpitations  Summer to Fall follow up unless new symptoms or abnormal test results warranting change in plan  Would be reasonable for  Video Visit Follow up Would be reasonable for  APP Follow up   Shared Decision Making/Informed Consent The risks [chest pain, shortness of breath, cardiac arrhythmias, dizziness, blood pressure fluctuations, myocardial infarction, stroke/transient ischemic attack, and life-threatening complications (estimated to be 1 in 10,000)], benefits (risk stratification, diagnosing coronary artery disease, treatment guidance) and alternatives of a stress or dobutamine stress echocardiogram were discussed in detail with Ms. 12-17-1970 and she agrees to proceed.  Medication Adjustments/Labs and Tests Ordered: Current medicines are reviewed at length with the patient today.  Concerns regarding medicines are outlined above.  Orders Placed This Encounter  Procedures  . Lipid panel  . Cardiac Stress Test: Informed Consent Details: Physician/Practitioner Attestation; Transcribe to consent form and obtain patient signature  . CARDIAC EVENT MONITOR  . ECHOCARDIOGRAM STRESS TEST  . ECHOCARDIOGRAM COMPLETE   Meds ordered this encounter  Medications  . diltiazem (CARDIZEM) 30 MG tablet    Sig: Take as needed for heart palpitations    Dispense:  30 tablet    Refill:  1    Patient Instructions  Medication Instructions:  Your physician has recommended you make the following change in your medication:  START: diltiazem (Cardizem) 30 mg every 6 hours as needed for heart palpitations  *If you need a refill on your cardiac medications before your next appointment, please call your pharmacy*   Lab Work: TODAY: FLP If you have labs (blood work) drawn today and your tests are completely normal, you will receive your results only by: Marland Kitchen MyChart Message (if you have MyChart)  OR . A paper copy in the mail If you have any lab test that is abnormal or we need to change your treatment, we will call you to review the results.   Testing/Procedures: Your physician has requested that you have a stress echocardiogram.  Please follow instruction sheet as given.  You will have to get a COVID test.   Your physician has requested that you have an echocardiogram. Echocardiography is a painless test that uses sound waves to create images of your heart. It provides your doctor with information about the size and shape of your heart and how well your heart's chambers and valves are working. This procedure takes approximately one hour. There are no restrictions for this procedure.  Your physician has requested that you wear a heart monitor.      Follow-Up: At [facility], you and your health needs are our priority.  As part of our continuing mission to provide you with exceptional heart care, we have created designated Provider Care Teams.  These Care Teams include your primary Cardiologist (physician) and Advanced Practice Providers (APPs -  Physician Assistants and Nurse Practitioners) who all work together to provide you with the care you need, when you need it.  We recommend signing up for the patient portal called "MyChart".  Sign up information is provided on this After Visit Summary.  MyChart is used to connect with patients for Virtual Visits (Telemedicine).  Patients are able to view lab/test results, encounter notes, upcoming appointments, etc.  Non-urgent messages can be sent to your provider as well.   To learn more about what you can do with MyChart, go to [email or web address].    Your next appointment:   5-6 month(s)  The format for your next appointment:   In Person  Provider:   You may see Christell Constant, MD or one of the following Advanced Practice Providers on your designated Care Team:    Ronie Spies, PA-C  Jacolyn Reedy, PA-C    Other  Instructions [employer] Cardiac Event Monitor Instructions Your physician has requested you wear your cardiac event monitor for __30___ days. [employer] may call or text to confirm a shipping address. The monitor will be sent to a land address via UPS. [employer] will not ship a monitor to a PO BOX. It typically takes 3-5 days to receive your monitor after it has been enrolled. [employer] will  assist with USPS tracking if your package is delayed. The telephone number for [employer] is [phone]. Once you have received your monitor, please review the enclosed instructions. Instruction tutorials can also be viewed under help and settings on the enclosed cell phone. Your monitor has already been registered assigning a specific monitor serial # to you.  Applying the monitor Remove cell phone from case and turn it on. The cell phone works as IT consultant and needs to be within [employer] of you at all times. The cell phone will need to be charged on a daily basis. We recommend you plug the cell phone into the enclosed charger at your bedside table every night.  Monitor batteries: You will receive two monitor batteries labelled #1 and #2. These are your recorders. Plug battery #2 onto the second connection on the enclosed charger. Keep one battery on the charger at all times. This will keep the monitor battery deactivated. It will also keep it fully charged for when you need to switch your monitor batteries. A small light will be blinking on the battery emblem when it is charging. The light on the battery emblem will remain on when the battery is fully charged.  Open package of a Monitor strip. Insert battery #1 into black hood on strip and gently squeeze monitor battery onto connection as indicated in instruction booklet. Set aside while preparing skin.  Choose location for your strip, vertical or horizontal, as indicated in the instruction booklet. Shave to remove all hair from location.  There cannot be any lotions, oils, powders, or colognes on skin where monitor is to be applied. Wipe skin clean with enclosed Saline wipe. Dry skin completely.  Peel paper labeled #1 off the back of the Monitor strip exposing the adhesive. Place the monitor on the chest in the vertical or horizontal position shown in the instruction booklet. One arrow on the monitor strip must be pointing upward. Carefully remove paper labeled #2, attaching remainder of strip to your skin. Try not to create any folds or wrinkles in the strip as you apply it.  Firmly press and release the circle in the center of the monitor battery. You will hear a small beep. This is turning the monitor battery on. The heart emblem on the monitor battery will light up every 5 seconds if the monitor battery in turned on and connected to the patient securely. Do not push and hold the circle down as this turns the monitor battery off. The cell phone will locate the monitor battery. A screen will appear on the cell phone checking the connection of your monitor strip. This may read poor connection initially but change to good connection within the next minute. Once your monitor accepts the connection you will hear a series of 3 beeps followed by a climbing crescendo of beeps. A screen will appear on the cell phone showing the two monitor strip placement options. Touch the picture that demonstrates where you applied the monitor strip.  Your monitor strip and battery are waterproof. You are able to shower, bathe, or swim with the monitor on. They just ask you do not submerge deeper than 3 feet underwater. We recommend removing the monitor if you are swimming in a lake, river, or ocean.  Your monitor battery will need to be switched to a fully charged monitor battery approximately once a week. The cell phone will alert you of an action which needs to be made.  On the cell phone, tap for details to reveal  connection status, monitor  battery status, and cell phone battery status. The green dots indicates your monitor is in good status. A red dot indicates there is something that needs your attention.  To record a symptom, click the circle on the monitor battery. In 30-60 seconds a list of symptoms will appear on the cell phone. Select your symptom and tap save. Your monitor will record a sustained or significant arrhythmia regardless of you clicking the button. Some patients do not feel the heart rhythm irregularities. [employer] will notify us of any serious or critical events.  Refer to instruction booklet for instructions on switching batteries, changing strips, the Do not disturb or Pause features, or any additional questions.  Call [employer] at [phone], to confirm your monitor is transmitting and record your baseline. They will answer any questions you may have regarding the monitor instructions at that time.  Returning the monitor to [employer] Place all equipment back into blue box. Peel off strip of paper to expose adhesive and close box securely. There is a prepaid UPS shipping label on this box. Drop in a UPS drop box, or at a UPS facility like [employer]. You may also contact [employer] to arrange UPS to pick up monitor package at your home.      Signed, Christell ConstantMahesh A Edgardo Petrenko, MD  02/24/2021 8:49 AM    [facility]

## 2021-02-26 ENCOUNTER — Ambulatory Visit (INDEPENDENT_AMBULATORY_CARE_PROVIDER_SITE_OTHER): Payer: Managed Care, Other (non HMO)

## 2021-02-26 DIAGNOSIS — R002 Palpitations: Secondary | ICD-10-CM

## 2021-02-26 DIAGNOSIS — E785 Hyperlipidemia, unspecified: Secondary | ICD-10-CM

## 2021-03-01 MED ORDER — ROSUVASTATIN CALCIUM 10 MG PO TABS: 10.0000 mg | ORAL_TABLET | Freq: Every day | ORAL | 3 refills | Status: AC

## 2021-03-01 NOTE — Telephone Encounter (Signed)
Called patient and notified her of Dr. Izora Ribas recommendation to start rosuvastatin 10 mg PO QD and recheck fasting labs in 3 months.  Lab appointment scheduled for 05/31/21.

## 2021-03-02 MED ORDER — DILTIAZEM HCL 30 MG PO TABS: 30.0000 mg | ORAL_TABLET | Freq: Four times a day (QID) | ORAL | 1 refills | Status: AC | PRN

## 2021-03-02 NOTE — Addendum Note (Signed)
Addended by: Macie Burows on: 03/02/2021 08:34 AM   Modules accepted: Orders

## 2021-03-08 ENCOUNTER — Telehealth: Payer: Self-pay | Admitting: *Deleted

## 2021-03-08 NOTE — Telephone Encounter (Signed)
   Patient Name: Teresa Ochoa  DOB: October 02, 1981  MRN: [id number]   Primary Cardiologist: Christell Constant, MD  Chart reviewed as part of pre-operative protocol coverage.   Patient's case has been reviewed by her primary cardiologist, she is cleared to proceed with surgery.  The patient was advised that if she develops new symptoms prior to surgery to contact our office to arrange for a follow-up visit, and she verbalized understanding.  SBE prophylaxis is not required for the patient from a cardiac standpoint.  I will route this recommendation to the requesting party via Epic fax function and remove from pre-op pool.  Please call with questions.  [place], [place] 03/08/2021, 2:26 PM

## 2021-03-08 NOTE — Telephone Encounter (Signed)
   [facility] Pre-operative Risk Assessment    Patient Name: Teresa Ochoa  DOB: 09/21/1981  MRN: [id number]   HEARTCARE STAFF: - Please ensure there is not already an duplicate clearance open for this procedure. - Under Visit Info/Reason for Call, type in Other and utilize the format Clearance MM/DD/YY or Clearance TBD. Do not use dashes or single digits. - If request is for dental extraction, please clarify the # of teeth to be extracted.  Request for surgical clearance:  1. What type of surgery is being performed? REMOVAL OF 6 TEETH SURGICALLY   2. When is this surgery scheduled? 03/16/21   3. What type of clearance is required (medical clearance vs. Pharmacy clearance to hold med vs. Both)? MEDICAL  4. Are there any medications that need to be held prior to surgery and how long? NONE LISTED   5. Practice name and name of physician performing surgery? [facility]; DR. Lyda Kalata   6. What is the office phone number? [phone]   7.   What is the office fax number? [phone]  8.   Anesthesia type (None, local, MAC, general) ? DEEP IV SEDATION   Julaine Hua 03/08/2021, 11:22 AM  _________________________________________________________________   (provider comments below)

## 2021-03-08 NOTE — Telephone Encounter (Signed)
Dr. Izora Ribas, please review.  Patient was recently seen for chronic chest pain syndrome.  Her stress echocardiogram is currently scheduled for 04/01/2021.  Does she need to push her dental surgery back until after the stress echo?  Please forward your comment to P [facility] PREOP

## 2021-03-08 NOTE — Telephone Encounter (Signed)
Dental should be a low risk procedure under local anesthesia. Reasonable to proceed to dental procedure unless worsening of sx.  Riley Lam, MD Cardiologist [facility]  [address] [place], [address] [place], [place] [address] [phone]  11:48 AM

## 2021-03-29 ENCOUNTER — Other Ambulatory Visit (HOSPITAL_COMMUNITY)
Admission: RE | Admit: 2021-03-29 | Discharge: 2021-03-29 | Disposition: A | Discharge status: Home / Self Care | Payer: Managed Care, Other (non HMO) | Source: Ambulatory Visit | Attending: Internal Medicine | Admitting: Internal Medicine

## 2021-03-29 DIAGNOSIS — Z01812 Encounter for preprocedural laboratory examination: Secondary | ICD-10-CM | POA: Diagnosis present

## 2021-03-29 DIAGNOSIS — Z20822 Contact with and (suspected) exposure to covid-19: Secondary | ICD-10-CM | POA: Insufficient documentation

## 2021-03-29 LAB — SARS CORONAVIRUS 2 (TAT 6-24 HRS): SARS Coronavirus 2: NEGATIVE

## 2021-03-31 ENCOUNTER — Telehealth (HOSPITAL_COMMUNITY): Payer: Self-pay

## 2021-03-31 NOTE — Telephone Encounter (Signed)
Detailed instructions given, asked to call back with any questions. S.Iraida Cragin EMTP 

## 2021-04-01 ENCOUNTER — Ambulatory Visit (HOSPITAL_COMMUNITY): Payer: Managed Care, Other (non HMO) | Attending: Cardiology

## 2021-04-01 ENCOUNTER — Ambulatory Visit (HOSPITAL_COMMUNITY): Payer: Managed Care, Other (non HMO)

## 2021-04-01 ENCOUNTER — Other Ambulatory Visit: Payer: Self-pay

## 2021-04-01 ENCOUNTER — Ambulatory Visit (HOSPITAL_BASED_OUTPATIENT_CLINIC_OR_DEPARTMENT_OTHER): Payer: Managed Care, Other (non HMO)

## 2021-04-01 DIAGNOSIS — R079 Chest pain, unspecified: Secondary | ICD-10-CM

## 2021-04-01 DIAGNOSIS — R55 Syncope and collapse: Secondary | ICD-10-CM | POA: Diagnosis present

## 2021-04-01 LAB — ECHOCARDIOGRAM COMPLETE
Area-P 1/2: 6.07 cm2
S' Lateral: 3.1 cm

## 2021-04-01 MED ORDER — PERFLUTREN LIPID MICROSPHERE
1.0000 mL | INTRAVENOUS | Status: AC | PRN
  Administered 2021-04-01: 4 mL via INTRAVENOUS

## 2021-05-31 ENCOUNTER — Other Ambulatory Visit: Payer: Managed Care, Other (non HMO)

## 2021-08-03 NOTE — Progress Notes (Deleted)
Cardiology Office Note:    Date:  08/03/2021   ID:  [name], DOB Nov 10, 1981, MRN [id number]  PCP:  Vivien Presto, MD   [facility]  Cardiologist:  Christell Constant, MD  Advanced Practice Provider:  No care team member to display Electrophysiologist:  None   CC: Follow up CP  History of Present Illness:    Teresa Ochoa is a 40 y.o. female with a hx of Former Tobacco Abuse, Familial Hyperlipidemia, gastroparesis,remote polysubstance absuse hx of suboxone, who presents 02/24/21.  In interim of this visit, patient had new racing heart beats while at work.  Went to ED.  Also has chest pain that radiates into her neck.  Seen 03/08/21 and had normal stress echo in May.  Had dental extraction.  Seen 08/04/21.  Patient notes that she is doing ***.  Since day prior/last visit notes *** changes.  Relevant interval testing or therapy include ***.  There are no*** interval hospital/ED visit.    No chest pain or pressure ***.  No SOB/DOE*** and no PND/Orthopnea***.  No weight gain or leg swelling***.  No palpitations or syncope ***.  Ambulatory blood pressure ***.   Past Medical History:  Diagnosis Date   Depression    Reports more depression since the accident    Enlarged liver    Gastroparesis    Heart abnormality 08/03/2016   place on Heart monitor for Afib   Migraine    Migraines 01/21/2016   MS (multiple sclerosis) (HCC)    Substance abuse (HCC)     Past Surgical History:  Procedure Laterality Date   ABDOMINAL HYSTERECTOMY     APPENDECTOMY     GALLBLADDER SURGERY     JOINT REPLACEMENT     SALIVARY GLAND SURGERY     TUBAL LIGATION      Current Medications: No outpatient medications have been marked as taking for the 08/04/21 encounter (Appointment) with Christell Constant, MD.     Allergies:   Ketorolac, Macrobid [nitrofurantoin monohyd macro], Other, Reglan [metoclopramide], Tylenol [acetaminophen], and Compazine   Social  History   Socioeconomic History   Marital status: Married    Spouse name: Not on file   Number of children: Not on file   Years of education: Not on file   Highest education level: Not on file  Occupational History   Not on file  Tobacco Use   Smoking status: Every Day    Packs/day: 1.00    Types: Cigarettes   Smokeless tobacco: Never  Substance and Sexual Activity   Alcohol use: Yes    Comment: occ   Drug use: No   Sexual activity: Not on file  Other Topics Concern   Not on file  Social History Narrative   Not on file   Social Determinants of Health   Financial Resource Strain: Not on file  Food Insecurity: Not on file  Transportation Needs: Not on file  Physical Activity: Not on file  Stress: Not on file  Social Connections: Not on file     Family History: The patient's family history includes Diabetes in her maternal grandmother; Heart disease in her maternal grandmother; Hypertension in her maternal grandmother.  ROS:   Please see the history of present illness.     All other systems reviewed and are negative.  EKGs/Labs/Other Studies Reviewed:    The following studies were reviewed today:  EKG:   02/19/21: SR 81 WNL  Cardiac CT: Date: 04/14/20 Results: IMPRESSION:  1. No evidence of CAD, CADRADS = 0.   2. Coronary calcium score of 0. This was 0 percentile for age and sex matched control.   3. Normal coronary origin with right dominance.   4. Borderline dilated main pulmonary artery at 27 mm, suggestive of possible pulmonary hypertension.  Cardiac Event Monitoring: Date: 03/30/21 Results: Patient had a minimum heart rate of 56 bpm, maximum heart rate of 143 bpm, and average heart rate of 80 bpm. Predominant underlying rhythm was sinus rhythm. Isolated PACs were rare (<1.0%). Isolated PVCs were rare (<1.0%). No evidence of complete heart block. No evidence of atrial fibrillation. Triggered and diary events associated with sinus rhythm and sinus  tachycardia.   No malignant arrhythmias.  Transthoracic Echocardiogram (rest and stress): Date: 04/01/21 Results:  1. Good exercise capacity, achieved 9 METS.   2. Normal BP response to exercise.   3. No evidence of ischemia on stress EKG.   4. This is a negative stress echocardiogram for ischemia.   5. This is a low risk study.    1. Left ventricular ejection fraction, by estimation, is 60 to 65%. Left  ventricular ejection fraction by 3D volume is 60 %. The left ventricle has  normal function. The left ventricle has no regional wall motion  abnormalities. There is mild left  ventricular hypertrophy. Left ventricular diastolic parameters were  normal.   2. Right ventricular systolic function is normal. The right ventricular  size is normal. There is normal pulmonary artery systolic pressure.   3. The mitral valve is normal in structure. Trivial mitral valve  regurgitation.   4. The aortic valve is tricuspid. Aortic valve regurgitation is not  visualized. No aortic stenosis is present.   5. The inferior vena cava is normal in size with greater than 50%  respiratory variability, suggesting right atrial pressure of 3 mmHg.  Recent Labs: 02/19/2021: BUN 10; Creatinine, Ser 0.59; Hemoglobin 14.1; Platelets 289; Potassium 4.0; Sodium 137  Recent Lipid Panel    Component Value Date/Time   CHOL 241 (H) 02/24/2021 0858   TRIG 254 (H) 02/24/2021 0858   HDL 40 02/24/2021 0858   CHOLHDL 6.0 (H) 02/24/2021 0858   LDLCALC 154 (H) 02/24/2021 9147    Physical Exam:    VS:  There were no vitals taken for this visit.    Wt Readings from Last 3 Encounters:  02/24/21 185 lb (83.9 kg)  02/19/21 186 lb 9.6 oz (84.6 kg)  03/25/20 199 lb (90.3 kg)     GEN:  Well nourished, well developed in no acute distress HEENT: Normal NECK: No JVD; *** LYMPHATICS: No lymphadenopathy CARDIAC: RRR, no murmurs, rubs, gallops RESPIRATORY:  Clear to auscultation without rales, wheezing or rhonchi   ABDOMEN: Soft, non-tender, non-distended MUSCULOSKELETAL:  No edema; No deformity  SKIN: Warm and dry NEUROLOGIC:  Alert and oriented x 3 PSYCHIATRIC:  Normal affect   ASSESSMENT:    No diagnosis found.  PLAN:    In order of problems listed above:  Familial Hyperocholesterolemia Tobacco Abuse (former) History of gestational DM - ***  Will plan for *** follow up unless new symptoms or abnormal test results warranting change in plan  Would be reasonable for *** APP Follow up    Medication Adjustments/Labs and Tests Ordered: Current medicines are reviewed at length with the patient today.  Concerns regarding medicines are outlined above.  No orders of the defined types were placed in this encounter.  No orders of the defined types were placed in this  encounter.   There are no Patient Instructions on file for this visit.   Signed, Christell Constant, MD  08/03/2021 6:20 PM    [facility]

## 2021-08-04 ENCOUNTER — Ambulatory Visit: Payer: Managed Care, Other (non HMO) | Admitting: Internal Medicine

## 2021-09-23 DIAGNOSIS — R6889 Other general symptoms and signs: Secondary | ICD-10-CM | POA: Diagnosis not present

## 2021-09-28 DIAGNOSIS — Z20822 Contact with and (suspected) exposure to covid-19: Secondary | ICD-10-CM | POA: Diagnosis not present

## 2021-11-11 DIAGNOSIS — R69 Illness, unspecified: Secondary | ICD-10-CM | POA: Diagnosis not present

## 2021-11-22 DIAGNOSIS — Z20828 Contact with and (suspected) exposure to other viral communicable diseases: Secondary | ICD-10-CM | POA: Diagnosis not present

## 2021-12-09 ENCOUNTER — Other Ambulatory Visit: Payer: Self-pay

## 2021-12-09 ENCOUNTER — Emergency Department (HOSPITAL_BASED_OUTPATIENT_CLINIC_OR_DEPARTMENT_OTHER)
Admission: EM | Admit: 2021-12-09 | Discharge: 2021-12-09 | Disposition: A | Discharge status: Home / Self Care | Payer: 59 | Attending: Emergency Medicine | Admitting: Emergency Medicine

## 2021-12-09 ENCOUNTER — Encounter (HOSPITAL_BASED_OUTPATIENT_CLINIC_OR_DEPARTMENT_OTHER): Payer: Self-pay | Admitting: *Deleted

## 2021-12-09 ENCOUNTER — Emergency Department (HOSPITAL_BASED_OUTPATIENT_CLINIC_OR_DEPARTMENT_OTHER): Payer: 59

## 2021-12-09 DIAGNOSIS — R079 Chest pain, unspecified: Secondary | ICD-10-CM

## 2021-12-09 DIAGNOSIS — R002 Palpitations: Secondary | ICD-10-CM | POA: Diagnosis not present

## 2021-12-09 DIAGNOSIS — R0789 Other chest pain: Secondary | ICD-10-CM | POA: Diagnosis not present

## 2021-12-09 DIAGNOSIS — R69 Illness, unspecified: Secondary | ICD-10-CM | POA: Diagnosis not present

## 2021-12-09 LAB — BASIC METABOLIC PANEL
Anion gap: 9 (ref 5–15)
BUN: 9 mg/dL (ref 6–20)
CO2: 24 mmol/L (ref 22–32)
Calcium: 9.2 mg/dL (ref 8.9–10.3)
Chloride: 105 mmol/L (ref 98–111)
Creatinine, Ser: 0.73 mg/dL (ref 0.44–1.00)
GFR, Estimated: >60 mL/min (ref >60)
Glucose, Bld: 115 mg/dL — ABNORMAL HIGH (ref 70–99)
Potassium: 3.7 mmol/L (ref 3.5–5.1)
Sodium: 138 mmol/L (ref 135–145)

## 2021-12-09 LAB — TROPONIN I (HIGH SENSITIVITY): Troponin I (High Sensitivity): <2 ng/L (ref <18)

## 2021-12-09 LAB — CBC WITH DIFFERENTIAL/PLATELET
Abs Immature Granulocytes: 0.02 10*3/uL (ref 0.00–0.07)
Basophils Absolute: 0.1 10*3/uL (ref 0.0–0.1)
Basophils Relative: 1 %
Eosinophils Absolute: 0.2 10*3/uL (ref 0.0–0.5)
Eosinophils Relative: 2 %
HCT: 39.4 % (ref 36.0–46.0)
Hemoglobin: 13.5 g/dL (ref 12.0–15.0)
Immature Granulocytes: 0 %
Lymphocytes Relative: 36 %
Lymphs Abs: 4.1 10*3/uL — ABNORMAL HIGH (ref 0.7–4.0)
MCH: 29.4 pg (ref 26.0–34.0)
MCHC: 34.3 g/dL (ref 30.0–36.0)
MCV: 85.8 fL (ref 80.0–100.0)
Monocytes Absolute: 0.9 10*3/uL (ref 0.1–1.0)
Monocytes Relative: 7 %
Neutro Abs: 6.2 10*3/uL (ref 1.7–7.7)
Neutrophils Relative %: 54 %
Platelets: 313 10*3/uL (ref 150–400)
RBC: 4.59 MIL/uL (ref 3.87–5.11)
RDW: 13.4 % (ref 11.5–15.5)
WBC: 11.5 10*3/uL — ABNORMAL HIGH (ref 4.0–10.5)
nRBC: 0 % (ref 0.0–0.2)

## 2021-12-09 LAB — MAGNESIUM: Magnesium: 1.9 mg/dL (ref 1.7–2.4)

## 2021-12-09 MED ORDER — SODIUM CHLORIDE 0.9 % IV BOLUS
1000.0000 mL | Freq: Once | INTRAVENOUS | Status: AC
  Administered 2021-12-09: 1000 mL via INTRAVENOUS

## 2021-12-09 NOTE — ED Notes (Signed)
Patient transported to X-ray 

## 2021-12-09 NOTE — ED Triage Notes (Signed)
Hx of atrial fib. She takes Cardizem. She had sudden onset of palpitations and chest pain all morning. She may have passed out. EKG at triage. She is ambulatory.

## 2021-12-09 NOTE — Discharge Instructions (Signed)
If you develop recurrent, continued, or worsening chest pain, shortness of breath, fever, vomiting, abdominal or back pain, or any other new/concerning symptoms then return to the ER for evaluation.  

## 2021-12-09 NOTE — ED Provider Notes (Signed)
[facility] EMERGENCY DEPARTMENT Provider Note   CSN: [id number] Arrival date & time: 12/09/21  1448     History  Chief Complaint  Patient presents with   Chest Pain   Palpitations    Teresa Ochoa Teresa Ochoa is a 41 y.o. female.  HPI 41 year old female presents with chest pain as well as palpitations.  She states she has been having some mild left superior/anterior chest soreness since this morning.  Seems to kind of come and go.  Nothing particular makes it worse such as position or deep inspiration.  Prior to arrival here, she was sitting in her desk and all of a sudden felt palpitations for a couple minutes and then feeling like she might pass out.  Coworker saw her but stated she did not pass out.  The chest pain has otherwise been fairly mild.  There was no shortness of breath associated with it.  No recent travel.  She has some chronic pedal edema that seems to be pretty good today.  No recent illness.  She has been taking her meds as prescribed.  Home Medications Prior to Admission medications   Medication Sig Start Date End Date Taking? Authorizing Provider  diltiazem (CARDIZEM) 30 MG tablet Take 1 tablet (30 mg total) by mouth every 6 (six) hours as needed. Take as needed for heart palpitations 03/02/21   Riley Lam A, MD  lactulose (CHRONULAC) 10 GM/15ML solution Take by mouth in the morning and at bedtime. 05/29/20   [provider]  montelukast (SINGULAIR) 10 MG tablet Take 1 tablet by mouth at bedtime. 09/03/20   [provider]  ondansetron (ZOFRAN-ODT) 4 MG disintegrating tablet Take 4 mg by mouth every 8 (eight) hours as needed. 12/04/20   [provider]  rosuvastatin (CRESTOR) 10 MG tablet Take 1 tablet (10 mg total) by mouth daily. 03/01/21   Christell Constant, MD      Allergies    Ketorolac, Macrobid [nitrofurantoin monohyd macro], Other, Reglan [metoclopramide], Tylenol [acetaminophen], and Compazine    Review of Systems    Review of Systems  Constitutional:  Negative for fever.  Respiratory:  Negative for cough and shortness of breath.   Cardiovascular:  Positive for chest pain, palpitations and leg swelling.  Gastrointestinal:  Negative for abdominal pain.  Musculoskeletal:  Negative for back pain.  Neurological:  Positive for light-headedness. Negative for syncope.   Physical Exam Updated Vital Signs BP 139/69 (BP Location: Right Arm)    Pulse 99    Temp 98.6 F (37 C) (Oral)    Resp 20    Ht 5\' 4"  (1.626 m)    Wt 85.3 kg    SpO2 99%    BMI 32.27 kg/m  Physical Exam Vitals and nursing note reviewed.  Constitutional:      General: She is not in acute distress.    Appearance: She is well-developed. She is not ill-appearing or diaphoretic.  HENT:     Head: Normocephalic and atraumatic.  Cardiovascular:     Rate and Rhythm: Normal rate and regular rhythm.     Heart sounds: Normal heart sounds.  Pulmonary:     Effort: Pulmonary effort is normal.     Breath sounds: Normal breath sounds. No wheezing, rhonchi or rales.  Chest:     Chest wall: No tenderness.  Abdominal:     Palpations: Abdomen is soft.     Tenderness: There is no abdominal tenderness.  Musculoskeletal:     Right lower leg: No edema.  Left lower leg: No edema.  Skin:    General: Skin is warm and dry.  Neurological:     Mental Status: She is alert.    ED Results / Procedures / Treatments   Labs (all labs ordered are listed, but only abnormal results are displayed) Labs Reviewed  BASIC METABOLIC PANEL - Abnormal; Notable for the following components:      Result Value   Glucose, Bld 115 (*)    All other components within normal limits  CBC WITH DIFFERENTIAL/PLATELET - Abnormal; Notable for the following components:   WBC 11.5 (*)    Lymphs Abs 4.1 (*)    All other components within normal limits  MAGNESIUM  TROPONIN I (HIGH SENSITIVITY)    EKG EKG Interpretation  Date/Time:  Thursday December 09 2021 14:57:45  EST Ventricular Rate:  81 PR Interval:  140 QRS Duration: 88 QT Interval:  376 QTC Calculation: 436 R Axis:   18 Text Interpretation: Normal sinus rhythm inferior ST changes similar to April 2022 Confirmed by Pricilla Loveless [phone]) on 12/09/2021 3:01:07 PM  Radiology DG Chest 2 View  Result Date: 12/09/2021 CLINICAL DATA:  Left chest pain EXAM: CHEST - 2 VIEW COMPARISON:  Chest radiograph dated February 19, 2021 FINDINGS: The heart size and mediastinal contours are within normal limits. Both lungs are clear. The visualized skeletal structures are unremarkable. IMPRESSION: No active cardiopulmonary disease. Electronically Signed   By: Larose Hires D.O.   On: 12/09/2021 15:39    Procedures Procedures    Medications Ordered in ED Medications  sodium chloride 0.9 % bolus 1,000 mL (1,000 mLs Intravenous New Bag/Given 12/09/21 1539)    ED Course/ Medical Decision Making/ A&P                            Patient with a brief episode of palpitations but no syncope.  Could have had some transient A. fib which she does have a paroxysmal history of, but she is asymptomatic now besides just feeling tired.  Her vital signs are unremarkable.  She has some nonspecific chest pain as well but on further talking to her, this has been an on and off problem for weeks.  Thus I think a single negative troponin is reasonable from an ACS standpoint.  My suspicion of ACS, PE, dissection, etc. is quite low.  I doubt an emergent arrhythmia such as significant heart block, V. fib, V. tach, etc. She does not appear to need admission.  History is from patient as well as boss at bedside, who confirmed she did not pass out today.  ECG reviewed/interpreted, shows chronic ST changes and sinus rhythm.  Labs reviewed/interpreted, negative troponin, slight leukocytosis that is nonspecific.  CXR reviewed/interpreted, and I see no obvious emergent findings and she has clear lungs.  Chart reviewed. Echo from May 2022 showed  normal LV function/diastolic function.  To review shows he had a similar ED presentation in April 2022.  She has had a history of paroxysmal A. fib per chart.  At this point she appears stable for discharge home to follow-up with her cardiologist.  We discussed return precautions.        Final Clinical Impression(s) / ED Diagnoses Final diagnoses:  Palpitations  Nonspecific chest pain    Rx / DC Orders ED Discharge Orders     None         Pricilla Loveless, MD 12/09/21 1636

## 2021-12-14 NOTE — Progress Notes (Signed)
Cardiology Office Note:    Date:  12/16/2021   ID:  [name], DOB October 30, 1981, MRN [id number]  PCP:  Vivien Presto, MD   [facility]  Cardiologist:  Christell Constant, MD  Advanced Practice Provider:  No care team member to display Electrophysiologist:  None      CC: Follow up CP  History of Present Illness:    Teresa Ochoa is a 41 y.o. female with a hx of Former Tobacco Abuse, Familial Hyperlipidemia, gastroparesis,remote polysubstance absuse hx of suboxone, who presents 02/24/21.  Had CP with negative stress Echo and negative [employer] monitor.  Missed fall follow up.  Seen 12/16/21.  Patient notes that she is doing poorly- related to arm and chest warmness, headaches, and palpitations. Last Thursday was at work and had a feeling of heart beating weird then passed out.  Patient is having have a warm uncomfortable feeling with bilateral clavicles. Symptoms were similar (potential worse) than last year with a negative stress test.  No SOB/DOE and no PND/Orthopnea.  No weight gain or leg swelling.  No change in palpitations .  Ambulatory blood pressure normal.  Had outpatient labs: LDL 112 on rosuvastatin (new start).   Past Medical History:  Diagnosis Date   Depression    Reports more depression since the accident    Enlarged liver    Gastroparesis    Heart abnormality 08/03/2016   place on Heart monitor for Afib   Migraine    Migraines 01/21/2016   MS (multiple sclerosis) (HCC)    Substance abuse (HCC)     Past Surgical History:  Procedure Laterality Date   ABDOMINAL HYSTERECTOMY     APPENDECTOMY     GALLBLADDER SURGERY     JOINT REPLACEMENT     SALIVARY GLAND SURGERY     TUBAL LIGATION      Current Medications: Current Meds  Medication Sig   albuterol (VENTOLIN HFA) 108 (90 Base) MCG/ACT inhaler Inhale into the lungs.   diltiazem (CARDIZEM) 30 MG tablet Take 1 tablet (30 mg total) by mouth every 6 (six) hours as needed.  Take as needed for heart palpitations   furosemide (LASIX) 20 MG tablet Take by mouth as needed.   rosuvastatin (CRESTOR) 10 MG tablet Take 1 tablet (10 mg total) by mouth daily.     Allergies:   Ketorolac, Macrobid [nitrofurantoin monohyd macro], Other, Reglan [metoclopramide], Tylenol [acetaminophen], and Compazine   Social History   Socioeconomic History   Marital status: Married    Spouse name: Not on file   Number of children: Not on file   Years of education: Not on file   Highest education level: Not on file  Occupational History   Not on file  Tobacco Use   Smoking status: Every Day    Packs/day: 1.00    Types: Cigarettes   Smokeless tobacco: Never  Vaping Use   Vaping Use: Some days  Substance and Sexual Activity   Alcohol use: Not Currently    Comment: occ   Drug use: No   Sexual activity: Not on file  Other Topics Concern   Not on file  Social History Narrative   Not on file   Social Determinants of Health   Financial Resource Strain: Not on file  Food Insecurity: Not on file  Transportation Needs: Not on file  Physical Activity: Not on file  Stress: Not on file  Social Connections: Not on file     Family History: The  patient's family history includes Diabetes in her maternal grandmother; Heart disease in her maternal grandmother; Hypertension in her maternal grandmother.  ROS:   Please see the history of present illness.     All other systems reviewed and are negative.  EKGs/Labs/Other Studies Reviewed:    The following studies were reviewed today:  EKG:   SR rate 82 WNL 02/19/21: SR 81 WNL  Cardiac CT: Date: 04/14/20 Results: IMPRESSION: 1. No evidence of CAD, CADRADS = 0.   2. Coronary calcium score of 0. This was 0 percentile for age and sex matched control.   3. Normal coronary origin with right dominance.   4. Borderline dilated main pulmonary artery at 27 mm, suggestive of possible pulmonary hypertension.   Negative Stress  test: Date: 04/01/21 Results: IMPRESSIONS     1. Good exercise capacity, achieved 9 METS.   2. Normal BP response to exercise.   3. No evidence of ischemia on stress EKG.   4. This is a negative stress echocardiogram for ischemia.   5. This is a low risk study.    Recent Labs: 12/09/2021: BUN 9; Creatinine, Ser 0.73; Hemoglobin 13.5; Magnesium 1.9; Platelets 313; Potassium 3.7; Sodium 138  Recent Lipid Panel    Component Value Date/Time   CHOL 241 (H) 02/24/2021 0858   TRIG 254 (H) 02/24/2021 0858   HDL 40 02/24/2021 0858   CHOLHDL 6.0 (H) 02/24/2021 0858   LDLCALC 154 (H) 02/24/2021 0858    Physical Exam:    VS:  BP 124/76 (BP Location: Left Arm, Patient Position: Sitting, Cuff Size: Large)    Pulse 82    Ht  (1.6 m)    Wt 85 kg    SpO2 96%    BMI 33.20 kg/m     Wt Readings from Last 3 Encounters:  12/16/21 85 kg  12/09/21 85.3 kg  02/24/21 83.9 kg    Gen: No distress,  Neck: No JVD Cardiac: No Rubs or Gallops, no Murmur, regular rate +2 radial pulses Respiratory: Clear to auscultation bilaterally, normal effort, normal  respiratory rate GI: Soft, nontender, non-distended  MS: No  edema;  moves all extremities Integument: Skin feels warm Neuro:  At time of evaluation, alert and oriented to person/place/time/situation  Psych: Normal affect, patient feels OK   ASSESSMENT:    1. Syncope, unspecified syncope type   2. Familial hypercholesterolemia   3. History of gestational diabetes     PLAN:    Non -Cardiac chest discomfort Familial Hyperocholesterolemia Tobacco Abuse (former) History of gestational DM Syncope - patent has atypical symptoms, recent negative stress echo and 0 calcium score - would evaluate for non cardiac chest pain etiologies, will send copy of not to PCP - if further ED visits, will get cardiac CT prior to follow up to confirm widely patent coronaries - LDL has improved on rosuvastatin 10 - patient continues to be smoke free - will  get 14 day non -live ziopatch - I have not started standing diltiazem as it makes her fatigues and I suspect drops her BP, has PRN dilt; would not start AAD unless clear arrhythmia noted  One year   Medication Adjustments/Labs and Tests Ordered: Current medicines are reviewed at length with the patient today.  Concerns regarding medicines are outlined above.  Orders Placed This Encounter  Procedures   LONG TERM MONITOR (3-14 DAYS)   EKG 12-Lead   No orders of the defined types were placed in this encounter.   Patient Instructions  Medication Instructions:  Your physician recommends that you continue on your current medications as directed. Please refer to the Current Medication list given to you today.  *If you need a refill on your cardiac medications before your next appointment, please call your pharmacy*   Lab Work: NONE If you have labs (blood work) drawn today and your tests are completely normal, you will receive your results only by: MyChart Message (if you have MyChart) OR A paper copy in the mail If you have any lab test that is abnormal or we need to change your treatment, we will call you to review the results.   Testing/Procedures: Your physician has requested that you wear a 14 day heart monitor.    Follow-Up: At [facility], you and your health needs are our priority.  As part of our continuing mission to provide you with exceptional heart care, we have created designated Provider Care Teams.  These Care Teams include your primary Cardiologist (physician) and Advanced Practice Providers (APPs -  Physician Assistants and Nurse Practitioners) who all work together to provide you with the care you need, when you need it.     Your next appointment:   1 year(s)  The format for your next appointment:   In Person  Provider:   Christell Constant, MD     Other Instructions  ZIO XT- Long Term Monitor Instructions  Your physician has requested you wear  a ZIO patch monitor for 14 days.  This is a single patch monitor. [employer] supplies one patch monitor per enrollment. Additional stickers are not available. Please do not apply patch if you will be having a Nuclear Stress Test,  Echocardiogram, Cardiac CT, MRI, or Chest Xray during the period you would be wearing the  monitor. The patch cannot be worn during these tests. You cannot remove and re-apply the  ZIO XT patch monitor.  Your ZIO patch monitor will be mailed 3 day USPS to your address on file. It may take 3-5 days  to receive your monitor after you have been enrolled.  Once you have received your monitor, please review the enclosed instructions. Your monitor  has already been registered assigning a specific monitor serial # to you.  Billing and Patient Assistance Program Information  We have supplied [employer] with any of your insurance information on file for billing purposes. [employer] offers a sliding scale Patient Assistance Program for patients that do not have  insurance, or whose insurance does not completely cover the cost of the ZIO monitor.  You must apply for the Patient Assistance Program to qualify for this discounted rate.  To apply, please call [employer] at [phone], select option 4, select option 2, ask to apply for  Patient Assistance Program. Meredeth Ide will ask your household income, and how many people  are in your household. They will quote your out-of-pocket cost based on that information.  [employer] will also be able to set up a 33-month, interest-free payment plan if needed.  Applying the monitor   Shave hair from upper left chest.  Hold abrader disc by orange tab. Rub abrader in 40 strokes over the upper left chest as  indicated in your monitor instructions.  Clean area with 4 enclosed alcohol pads. Let dry.  Apply patch as indicated in monitor instructions. Patch will be placed under collarbone on left  side of chest with arrow pointing upward.  Rub patch  adhesive wings for 2 minutes. Remove white label marked "1". Remove the white  label marked "2". Rub  patch adhesive wings for 2 additional minutes.  While looking in a mirror, press and release button in center of patch. A small green light will  flash 3-4 times. This will be your only indicator that the monitor has been turned on.  Do not shower for the first 24 hours. You may shower after the first 24 hours.  Press the button if you feel a symptom. You will hear a small click. Record Date, Time and  Symptom in the Patient Logbook.  When you are ready to remove the patch, follow instructions on the last 2 pages of Patient  Logbook. Stick patch monitor onto the last page of Patient Logbook.  Place Patient Logbook in the blue and white box. Use locking tab on box and tape box closed  securely. The blue and white box has prepaid postage on it. Please place it in the mailbox as  soon as possible. Your physician should have your test results approximately 7 days after the  monitor has been mailed back to [facility]  Call [facility] Customer Care at [phone] if you have questions regarding  your ZIO XT patch monitor. Call them immediately if you see an orange light blinking on your  monitor.  If your monitor falls off in less than 4 days, contact our Monitor department at [phone].  If your monitor becomes loose or falls off after 4 days call [employer] at [phone] for  suggestions on securing your monitor     Signed, Christell Constant, MD  12/16/2021 9:31 AM    [facility]

## 2021-12-16 ENCOUNTER — Ambulatory Visit: Payer: 59 | Admitting: Internal Medicine

## 2021-12-16 ENCOUNTER — Encounter: Payer: Self-pay | Admitting: Internal Medicine

## 2021-12-16 ENCOUNTER — Other Ambulatory Visit: Payer: Self-pay

## 2021-12-16 ENCOUNTER — Ambulatory Visit (INDEPENDENT_AMBULATORY_CARE_PROVIDER_SITE_OTHER): Payer: 59

## 2021-12-16 VITALS — BP 124/76 | HR 82 | Ht 63.0 in | Wt 187.4 lb

## 2021-12-16 DIAGNOSIS — E7801 Familial hypercholesterolemia: Secondary | ICD-10-CM

## 2021-12-16 DIAGNOSIS — R55 Syncope and collapse: Secondary | ICD-10-CM

## 2021-12-16 DIAGNOSIS — Z8632 Personal history of gestational diabetes: Secondary | ICD-10-CM | POA: Diagnosis not present

## 2021-12-16 NOTE — Progress Notes (Unsigned)
Enrolled patient for a 14 day Zio XT  monitor to be mailed to patients home  °

## 2021-12-16 NOTE — Patient Instructions (Signed)
Medication Instructions:  Your physician recommends that you continue on your current medications as directed. Please refer to the Current Medication list given to you today.  *If you need a refill on your cardiac medications before your next appointment, please call your pharmacy*   Lab Work: NONE If you have labs (blood work) drawn today and your tests are completely normal, you will receive your results only by: MyChart Message (if you have MyChart) OR A paper copy in the mail If you have any lab test that is abnormal or we need to change your treatment, we will call you to review the results.   Testing/Procedures: Your physician has requested that you wear a 14 day heart monitor.    Follow-Up: At [facility], you and your health needs are our priority.  As part of our continuing mission to provide you with exceptional heart care, we have created designated Provider Care Teams.  These Care Teams include your primary Cardiologist (physician) and Advanced Practice Providers (APPs -  Physician Assistants and Nurse Practitioners) who all work together to provide you with the care you need, when you need it.     Your next appointment:   1 year(s)  The format for your next appointment:   In Person  Provider:   Christell Constant, MD     Other Instructions  ZIO XT- Long Term Monitor Instructions  Your physician has requested you wear a ZIO patch monitor for 14 days.  This is a single patch monitor. [employer] supplies one patch monitor per enrollment. Additional stickers are not available. Please do not apply patch if you will be having a Nuclear Stress Test,  Echocardiogram, Cardiac CT, MRI, or Chest Xray during the period you would be wearing the  monitor. The patch cannot be worn during these tests. You cannot remove and re-apply the  ZIO XT patch monitor.  Your ZIO patch monitor will be mailed 3 day USPS to your address on file. It may take 3-5 days  to receive your  monitor after you have been enrolled.  Once you have received your monitor, please review the enclosed instructions. Your monitor  has already been registered assigning a specific monitor serial # to you.  Billing and Patient Assistance Program Information  We have supplied [employer] with any of your insurance information on file for billing purposes. [employer] offers a sliding scale Patient Assistance Program for patients that do not have  insurance, or whose insurance does not completely cover the cost of the ZIO monitor.  You must apply for the Patient Assistance Program to qualify for this discounted rate.  To apply, please call [employer] at [phone], select option 4, select option 2, ask to apply for  Patient Assistance Program. Meredeth Ide will ask your household income, and how many people  are in your household. They will quote your out-of-pocket cost based on that information.  [employer] will also be able to set up a 86-month, interest-free payment plan if needed.  Applying the monitor   Shave hair from upper left chest.  Hold abrader disc by orange tab. Rub abrader in 40 strokes over the upper left chest as  indicated in your monitor instructions.  Clean area with 4 enclosed alcohol pads. Let dry.  Apply patch as indicated in monitor instructions. Patch will be placed under collarbone on left  side of chest with arrow pointing upward.  Rub patch adhesive wings for 2 minutes. Remove white label marked "1". Remove the white  label marked "  2". Rub patch adhesive wings for 2 additional minutes.  While looking in a mirror, press and release button in center of patch. A small green light will  flash 3-4 times. This will be your only indicator that the monitor has been turned on.  Do not shower for the first 24 hours. You may shower after the first 24 hours.  Press the button if you feel a symptom. You will hear a small click. Record Date, Time and  Symptom in the Patient Logbook.  When you  are ready to remove the patch, follow instructions on the last 2 pages of Patient  Logbook. Stick patch monitor onto the last page of Patient Logbook.  Place Patient Logbook in the blue and white box. Use locking tab on box and tape box closed  securely. The blue and white box has prepaid postage on it. Please place it in the mailbox as  soon as possible. Your physician should have your test results approximately 7 days after the  monitor has been mailed back to [facility].  Call [facility] Customer Care at [phone] if you have questions regarding  your ZIO XT patch monitor. Call them immediately if you see an orange light blinking on your  monitor.  If your monitor falls off in less than 4 days, contact our Monitor department at [phone].  If your monitor becomes loose or falls off after 4 days call [employer] at [phone] for  suggestions on securing your monitor

## 2021-12-18 DIAGNOSIS — R55 Syncope and collapse: Secondary | ICD-10-CM | POA: Diagnosis not present

## 2022-01-05 DIAGNOSIS — Z20822 Contact with and (suspected) exposure to covid-19: Secondary | ICD-10-CM | POA: Diagnosis not present

## 2022-01-07 DIAGNOSIS — R55 Syncope and collapse: Secondary | ICD-10-CM | POA: Diagnosis not present

## 2022-01-07 DIAGNOSIS — Z20822 Contact with and (suspected) exposure to covid-19: Secondary | ICD-10-CM | POA: Diagnosis not present

## 2022-01-10 ENCOUNTER — Encounter: Payer: Self-pay | Admitting: Internal Medicine

## 2022-01-23 DIAGNOSIS — Z20822 Contact with and (suspected) exposure to covid-19: Secondary | ICD-10-CM | POA: Diagnosis not present

## 2022-01-25 ENCOUNTER — Emergency Department (HOSPITAL_BASED_OUTPATIENT_CLINIC_OR_DEPARTMENT_OTHER): Payer: No Typology Code available for payment source

## 2022-01-25 ENCOUNTER — Emergency Department (HOSPITAL_BASED_OUTPATIENT_CLINIC_OR_DEPARTMENT_OTHER)
Admission: EM | Admit: 2022-01-25 | Discharge: 2022-01-26 | Disposition: A | Discharge status: Home / Self Care | Payer: No Typology Code available for payment source | Attending: Emergency Medicine | Admitting: Emergency Medicine

## 2022-01-25 ENCOUNTER — Other Ambulatory Visit: Payer: Self-pay

## 2022-01-25 ENCOUNTER — Encounter (HOSPITAL_BASED_OUTPATIENT_CLINIC_OR_DEPARTMENT_OTHER): Payer: Self-pay | Admitting: Emergency Medicine

## 2022-01-25 DIAGNOSIS — J929 Pleural plaque without asbestos: Secondary | ICD-10-CM | POA: Diagnosis not present

## 2022-01-25 DIAGNOSIS — R0789 Other chest pain: Secondary | ICD-10-CM | POA: Diagnosis not present

## 2022-01-25 DIAGNOSIS — F17210 Nicotine dependence, cigarettes, uncomplicated: Secondary | ICD-10-CM | POA: Diagnosis not present

## 2022-01-25 DIAGNOSIS — R918 Other nonspecific abnormal finding of lung field: Secondary | ICD-10-CM | POA: Diagnosis not present

## 2022-01-25 DIAGNOSIS — R079 Chest pain, unspecified: Secondary | ICD-10-CM | POA: Diagnosis not present

## 2022-01-25 DIAGNOSIS — R69 Illness, unspecified: Secondary | ICD-10-CM | POA: Diagnosis not present

## 2022-01-25 HISTORY — DX: Other chronic pain: G89.29

## 2022-01-25 HISTORY — DX: Palpitations: R00.2

## 2022-01-25 HISTORY — DX: Opioid dependence, uncomplicated: F11.20

## 2022-01-25 HISTORY — DX: Chronic pain syndrome: G89.4

## 2022-01-25 HISTORY — DX: Migraine, unspecified, not intractable, without status migrainosus: G43.909

## 2022-01-25 HISTORY — DX: Gastroparesis: K31.84

## 2022-01-25 HISTORY — DX: Chest pain, unspecified: R07.9

## 2022-01-25 LAB — COMPREHENSIVE METABOLIC PANEL
ALT: 17 U/L (ref 0–44)
AST: 18 U/L (ref 15–41)
Albumin: 4.6 g/dL (ref 3.5–5.0)
Alkaline Phosphatase: 66 U/L (ref 38–126)
Anion gap: 9 (ref 5–15)
BUN: 12 mg/dL (ref 6–20)
CO2: 23 mmol/L (ref 22–32)
Calcium: 9.4 mg/dL (ref 8.9–10.3)
Chloride: 102 mmol/L (ref 98–111)
Creatinine, Ser: 0.56 mg/dL (ref 0.44–1.00)
GFR, Estimated: >60 mL/min (ref >60)
Glucose, Bld: 108 mg/dL — ABNORMAL HIGH (ref 70–99)
Potassium: 3.6 mmol/L (ref 3.5–5.1)
Sodium: 134 mmol/L — ABNORMAL LOW (ref 135–145)
Total Bilirubin: 0.3 mg/dL (ref 0.3–1.2)
Total Protein: 7.6 g/dL (ref 6.5–8.1)

## 2022-01-25 LAB — CBC WITH DIFFERENTIAL/PLATELET
Abs Immature Granulocytes: 0.04 10*3/uL (ref 0.00–0.07)
Basophils Absolute: 0.1 10*3/uL (ref 0.0–0.1)
Basophils Relative: 1 %
Eosinophils Absolute: 0.3 10*3/uL (ref 0.0–0.5)
Eosinophils Relative: 2 %
HCT: 42.1 % (ref 36.0–46.0)
Hemoglobin: 14.1 g/dL (ref 12.0–15.0)
Immature Granulocytes: 0 %
Lymphocytes Relative: 34 %
Lymphs Abs: 5.1 10*3/uL — ABNORMAL HIGH (ref 0.7–4.0)
MCH: 28.5 pg (ref 26.0–34.0)
MCHC: 33.5 g/dL (ref 30.0–36.0)
MCV: 85.1 fL (ref 80.0–100.0)
Monocytes Absolute: 1 10*3/uL (ref 0.1–1.0)
Monocytes Relative: 7 %
Neutro Abs: 8.3 10*3/uL — ABNORMAL HIGH (ref 1.7–7.7)
Neutrophils Relative %: 56 %
Platelets: 303 10*3/uL (ref 150–400)
RBC: 4.95 MIL/uL (ref 3.87–5.11)
RDW: 13.6 % (ref 11.5–15.5)
WBC: 14.9 10*3/uL — ABNORMAL HIGH (ref 4.0–10.5)
nRBC: 0 % (ref 0.0–0.2)

## 2022-01-25 LAB — TROPONIN I (HIGH SENSITIVITY): Troponin I (High Sensitivity): <2 ng/L (ref <18)

## 2022-01-25 NOTE — ED Triage Notes (Signed)
Pt c/o left upper chest pain under left clavicle radiating to left shoulder blade. Symptoms ongoing all day today with diaphoresis earlier.  ?

## 2022-01-26 ENCOUNTER — Encounter: Payer: Self-pay | Admitting: Internal Medicine

## 2022-01-26 ENCOUNTER — Emergency Department (HOSPITAL_BASED_OUTPATIENT_CLINIC_OR_DEPARTMENT_OTHER): Payer: No Typology Code available for payment source

## 2022-01-26 DIAGNOSIS — J929 Pleural plaque without asbestos: Secondary | ICD-10-CM | POA: Diagnosis not present

## 2022-01-26 DIAGNOSIS — R918 Other nonspecific abnormal finding of lung field: Secondary | ICD-10-CM | POA: Diagnosis not present

## 2022-01-26 LAB — TROPONIN I (HIGH SENSITIVITY): Troponin I (High Sensitivity): <2 ng/L (ref <18)

## 2022-01-26 MED ORDER — IOHEXOL 350 MG/ML SOLN
75.0000 mL | Freq: Once | INTRAVENOUS | Status: AC | PRN
  Administered 2022-01-26: 75 mL via INTRAVENOUS

## 2022-01-26 NOTE — ED Notes (Signed)
Pt A&OX4 ambulatory at d/c with independent steady gait. Pt verbalized understanding of d/c instructions and follow up care. 

## 2022-01-26 NOTE — ED Provider Notes (Signed)
?[facility] ?[facility] Emergency Department ?Provider Note ?MRN:  [id number]  ?Arrival date & time: 01/26/22    ? ?Chief Complaint   ?Chest Pain ?  ?History of Present Illness   ?Teresa Ochoa is a 41 y.o. year-old female with a history of chronic pain presenting to the ED with chief complaint of chest pain. ? ?1 month of persistent left-sided chest pain.  Located near the left clavicle with radiation to the left shoulder blade.  Feels like a deep pain.  Nothing changes it, no change with deep breaths or position or exertion.  Denies shortness of breath.  Smokes daily. ? ?Review of Systems  ?A thorough review of systems was obtained and all systems are negative except as noted in the HPI and PMH.  ? ?Patient's Health History   ? ?Past Medical History:  ?Diagnosis Date  ? Chest pain of unknown etiology   ? Chronic back pain   ? Chronic leg pain   ? Chronic pain syndrome   ? Gastroparesis   ? Migraine   ? 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 Palpitations   ?  ?Past Surgical History:  ?Procedure Laterality Date  ? ABDOMINAL HYSTERECTOMY    ? CHOLECYSTECTOMY    ?  ?History reviewed. No pertinent family history.  ?Social History  ? ?Socioeconomic History  ? Marital status: Married  ?  Spouse name: Not on file  ? Number of children: Not on file  ? Years of education: Not on file  ? Highest education level: Not on file  ?Occupational History  ? Not on file  ?Tobacco Use  ? Smoking status: Every Day  ?  Types: Cigarettes  ? Smokeless tobacco: Never  ?Substance and Sexual Activity  ? Alcohol use: Not on file  ? Drug use: Not on file  ? Sexual activity: Not on file  ?Other Topics Concern  ? Not on file  ?Social History Narrative  ? Not on file  ? ?Social Determinants of Health  ? ?Financial Resource Strain: Not on file  ?Food Insecurity: Not on file  ?Transportation Needs: Not on file  ?Physical Activity: Not on file  ?Stress: Not on file  ?Social Connections: Not on file  ?Intimate Partner  Violence: Not on file  ?  ? ?Physical Exam  ? ?Vitals:  ? 01/25/22 2213 01/25/22 2346  ?BP: (!) 142/90 119/64  ?Pulse: 97 70  ?Resp: 16 14  ?Temp: 98.4 ?F (36.9 ?C)   ?SpO2: 100% 97%  ?  ?CONSTITUTIONAL: Well-appearing, NAD ?NEURO/PSYCH:  Alert and oriented x 3, no focal deficits ?EYES:  eyes equal and reactive ?ENT/NECK:  no LAD, no JVD ?CARDIO: Regular rate, well-perfused, normal S1 and S2 ?PULM:  CTAB no wheezing or rhonchi ?GI/GU:  non-distended, non-tender ?MSK/SPINE:  No gross deformities, no edema ?SKIN:  no rash, atraumatic ? ? ?*Additional and/or pertinent findings included in MDM below ? ?Diagnostic and Interventional Summary  ? ? EKG Interpretation ? ?Date/Time:  Tuesday January 25 2022 22:16:11 EST ?Ventricular Rate:  93 ?PR Interval:  134 ?QRS Duration: 86 ?QT Interval:  364 ?QTC Calculation: 452 ?R Axis:   9 ?Text Interpretation: Normal sinus rhythm Nonspecific ST abnormality Abnormal ECG No previous ECGs available Confirmed by Gerlene Fee [phone]) on 01/26/2022 12:20:47 AM ?  ? ?  ? ?Labs Reviewed  ?CBC WITH DIFFERENTIAL/PLATELET - Abnormal; Notable for the following components:  ?    Result Value  ? WBC 14.9 (*)   ? Neutro Abs  8.3 (*)   ? Lymphs Abs 5.1 (*)   ? All other components within normal limits  ?COMPREHENSIVE METABOLIC PANEL - Abnormal; Notable for the following components:  ? Sodium 134 (*)   ? Glucose, Bld 108 (*)   ? All other components within normal limits  ?TROPONIN I (HIGH SENSITIVITY)  ?TROPONIN I (HIGH SENSITIVITY)  ?  ?CT Angio Chest Pulmonary Embolism (PE) W or WO Contrast  ?Final Result  ?  ?DG Chest 2 View  ?Final Result  ?  ?  ?Medications  ?iohexol (OMNIPAQUE) 350 MG/ML injection 75 mL (75 mLs Intravenous Contrast Given 01/26/22 0051)  ?  ? ?Procedures  /  Critical Care ?Procedures ? ?ED Course and Medical Decision Making  ?Initial Impression and Ddx ?1 month of persistent chest pain, exam does not suggest a muscular cause, DDx includes PE, neoplasm, occult pneumonia.  ACS  considered but felt to be less likely. ? ?Past medical/surgical history that increases complexity of ED encounter: Chronic pain, tobacco use ? ?Interpretation of Diagnostics ?I personally reviewed the EKG and my interpretation is as follows: Nonspecific ST/T wave findings ?   ?Labs reveal no significant blood count or electrolyte disturbance, troponin is negative x2, CTA is normal ? ?Patient Reassessment and Ultimate Disposition/Management ?Discharge home. ? ?Patient management required discussion with the following services or consulting groups:  None ? ?Complexity of Problems Addressed ?Acute illness or injury that poses threat of life of bodily function ? ?Additional Data Reviewed and Analyzed ?Further history obtained from: ?None ? ?Additional Factors Impacting ED Encounter Risk ?None ? ?Barth Kirks. Sedonia Small, MD ?[facility] Emergency Medicine ?[email or web address] ? ?Final Clinical Impressions(s) / ED Diagnoses  ? ?  ICD-10-CM   ?1. Chest pain, unspecified type  R07.9   ?  ?  ?ED Discharge Orders   ? ? None  ? ?  ?  ? ?Discharge Instructions Discussed with and Provided to Patient:  ? ? ? ?Discharge Instructions   ? ?  ?You were evaluated in the Emergency Department and after careful evaluation, we did not find any emergent condition requiring admission or further testing in the hospital. ? ?Your exam/testing today is overall reassuring.  Recommend Tylenol or Motrin for your pain and follow-up with your PCP or cardiologist. ? ?Please return to the Emergency Department if you experience any worsening of your condition.   Thank you for allowing [place] to be a part of your care. ? ? ? ? ?  ?Maudie Flakes, MD ?01/26/22 0121 ? ?

## 2022-01-26 NOTE — ED Notes (Signed)
ED Provider at bedside. 

## 2022-01-26 NOTE — Discharge Instructions (Signed)
You were evaluated in the Emergency Department and after careful evaluation, we did not find any emergent condition requiring admission or further testing in the hospital. ? ?Your exam/testing today is overall reassuring.  Recommend Tylenol or Motrin for your pain and follow-up with your PCP or cardiologist. ? ?Please return to the Emergency Department if you experience any worsening of your condition.   Thank you for allowing [place] to be a part of your care. ?

## 2022-02-11 DIAGNOSIS — D72829 Elevated white blood cell count, unspecified: Secondary | ICD-10-CM | POA: Diagnosis not present

## 2022-02-15 DIAGNOSIS — R55 Syncope and collapse: Secondary | ICD-10-CM | POA: Diagnosis not present

## 2022-02-15 DIAGNOSIS — R002 Palpitations: Secondary | ICD-10-CM | POA: Diagnosis not present

## 2022-02-24 DIAGNOSIS — R7989 Other specified abnormal findings of blood chemistry: Secondary | ICD-10-CM | POA: Diagnosis not present

## 2022-02-24 DIAGNOSIS — M79661 Pain in right lower leg: Secondary | ICD-10-CM | POA: Diagnosis not present

## 2022-02-24 DIAGNOSIS — R2241 Localized swelling, mass and lump, right lower limb: Secondary | ICD-10-CM | POA: Diagnosis not present

## 2022-02-24 DIAGNOSIS — I4891 Unspecified atrial fibrillation: Secondary | ICD-10-CM | POA: Diagnosis not present

## 2022-02-24 DIAGNOSIS — R42 Dizziness and giddiness: Secondary | ICD-10-CM | POA: Diagnosis not present

## 2022-02-24 DIAGNOSIS — R0989 Other specified symptoms and signs involving the circulatory and respiratory systems: Secondary | ICD-10-CM | POA: Diagnosis not present

## 2022-02-24 DIAGNOSIS — R002 Palpitations: Secondary | ICD-10-CM | POA: Diagnosis not present

## 2022-02-24 DIAGNOSIS — M7989 Other specified soft tissue disorders: Secondary | ICD-10-CM | POA: Diagnosis not present

## 2022-03-11 DIAGNOSIS — E669 Obesity, unspecified: Secondary | ICD-10-CM | POA: Diagnosis not present

## 2022-03-11 DIAGNOSIS — D72821 Monocytosis (symptomatic): Secondary | ICD-10-CM | POA: Diagnosis not present

## 2022-03-11 DIAGNOSIS — D72820 Lymphocytosis (symptomatic): Secondary | ICD-10-CM | POA: Diagnosis not present

## 2022-03-11 DIAGNOSIS — E6609 Other obesity due to excess calories: Secondary | ICD-10-CM | POA: Diagnosis not present

## 2022-03-11 DIAGNOSIS — Z6834 Body mass index (BMI) 34.0-34.9, adult: Secondary | ICD-10-CM | POA: Diagnosis not present

## 2022-03-11 DIAGNOSIS — R69 Illness, unspecified: Secondary | ICD-10-CM | POA: Diagnosis not present

## 2022-04-25 ENCOUNTER — Emergency Department (HOSPITAL_BASED_OUTPATIENT_CLINIC_OR_DEPARTMENT_OTHER): Payer: No Typology Code available for payment source

## 2022-04-25 ENCOUNTER — Emergency Department (HOSPITAL_BASED_OUTPATIENT_CLINIC_OR_DEPARTMENT_OTHER)
Admission: EM | Admit: 2022-04-25 | Discharge: 2022-04-25 | Disposition: A | Discharge status: Home / Self Care | Payer: No Typology Code available for payment source | Attending: Emergency Medicine | Admitting: Emergency Medicine

## 2022-04-25 ENCOUNTER — Other Ambulatory Visit: Payer: Self-pay

## 2022-04-25 ENCOUNTER — Encounter (HOSPITAL_BASED_OUTPATIENT_CLINIC_OR_DEPARTMENT_OTHER): Payer: Self-pay | Admitting: Pediatrics

## 2022-04-25 DIAGNOSIS — R69 Illness, unspecified: Secondary | ICD-10-CM | POA: Diagnosis not present

## 2022-04-25 DIAGNOSIS — R5383 Other fatigue: Secondary | ICD-10-CM | POA: Diagnosis not present

## 2022-04-25 DIAGNOSIS — R0789 Other chest pain: Secondary | ICD-10-CM | POA: Diagnosis not present

## 2022-04-25 DIAGNOSIS — R002 Palpitations: Secondary | ICD-10-CM | POA: Insufficient documentation

## 2022-04-25 DIAGNOSIS — R079 Chest pain, unspecified: Secondary | ICD-10-CM | POA: Diagnosis not present

## 2022-04-25 DIAGNOSIS — R072 Precordial pain: Secondary | ICD-10-CM | POA: Diagnosis not present

## 2022-04-25 DIAGNOSIS — F17200 Nicotine dependence, unspecified, uncomplicated: Secondary | ICD-10-CM | POA: Insufficient documentation

## 2022-04-25 DIAGNOSIS — D72829 Elevated white blood cell count, unspecified: Secondary | ICD-10-CM | POA: Insufficient documentation

## 2022-04-25 LAB — BASIC METABOLIC PANEL
Anion gap: 8 (ref 5–15)
BUN: 13 mg/dL (ref 6–20)
CO2: 27 mmol/L (ref 22–32)
Calcium: 9.3 mg/dL (ref 8.9–10.3)
Chloride: 105 mmol/L (ref 98–111)
Creatinine, Ser: 0.8 mg/dL (ref 0.44–1.00)
GFR, Estimated: >60 mL/min (ref >60)
Glucose, Bld: 132 mg/dL — ABNORMAL HIGH (ref 70–99)
Potassium: 3.7 mmol/L (ref 3.5–5.1)
Sodium: 140 mmol/L (ref 135–145)

## 2022-04-25 LAB — HCG, SERUM, QUALITATIVE: Preg, Serum: NEGATIVE

## 2022-04-25 LAB — CBC
HCT: 40.4 % (ref 36.0–46.0)
Hemoglobin: 13.9 g/dL (ref 12.0–15.0)
MCH: 29.6 pg (ref 26.0–34.0)
MCHC: 34.4 g/dL (ref 30.0–36.0)
MCV: 86 fL (ref 80.0–100.0)
Platelets: 293 10*3/uL (ref 150–400)
RBC: 4.7 MIL/uL (ref 3.87–5.11)
RDW: 13.6 % (ref 11.5–15.5)
WBC: 11.8 10*3/uL — ABNORMAL HIGH (ref 4.0–10.5)
nRBC: 0 % (ref 0.0–0.2)

## 2022-04-25 LAB — TROPONIN I (HIGH SENSITIVITY)
Troponin I (High Sensitivity): <2 ng/L (ref <18)
Troponin I (High Sensitivity): <2 ng/L (ref <18)

## 2022-04-25 MED ORDER — SODIUM CHLORIDE 0.9 % IV BOLUS
500.0000 mL | Freq: Once | INTRAVENOUS | Status: AC
  Administered 2022-04-25: 500 mL via INTRAVENOUS

## 2022-04-25 MED ORDER — IBUPROFEN 100 MG/5ML PO SUSP
600.0000 mg | Freq: Once | ORAL | Status: AC
  Administered 2022-04-25: 600 mg via ORAL
  Filled 2022-04-25: qty 30

## 2022-04-25 NOTE — ED Provider Notes (Signed)
Emergency Department Provider Note   I have reviewed the triage vital signs and the nursing notes.   HISTORY  Chief Complaint Chest Pain   HPI Teresa Ochoa is a 41 y.o. female presents to the ED with heart palpitations and CP. Patient notes history of this in the past and notes being diagnosed with PAF, although not mentioned in Cardiology notes, and followed by [facility]. She is not anticoagulated. She describes a tightness in the mid to right chest without exertional or pleuritic component. No SOB. No fever. Husband reports checking HR with home pulse ox meter at home and seeing reading in the 130-150 range. She is compliant with her diltiazem. She took some today but notes that she does develop fatigue with this medication in the past.     Past Medical History:  Diagnosis Date   Chest pain of unknown etiology    Chronic back pain    Chronic leg pain    Chronic pain syndrome    Depression    Reports more depression since the accident    Enlarged liver    Gastroparesis    Heart abnormality 08/03/2016   place on Heart monitor for Afib   Migraine    Migraine    Migraines 01/21/2016   MS (multiple sclerosis) (HCC)    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)    Palpitations    Substance abuse (HCC)     Review of Systems  Constitutional: No fever/chills Eyes: No visual changes. ENT: No sore throat. Cardiovascular: Positive chest pain and heart palpitations.  Respiratory: Denies shortness of breath. Gastrointestinal: No abdominal pain.  No nausea, no vomiting.  No diarrhea.  No constipation. Genitourinary: Negative for dysuria. Musculoskeletal: Negative for back pain. Skin: Negative for rash. Neurological: Negative for headaches, focal weakness or numbness.  ____________________________________________   PHYSICAL EXAM:  VITAL SIGNS: ED Triage Vitals  Enc Vitals Group     BP 04/25/22 2017 121/79     Pulse Rate 04/25/22 2017 94     Resp 04/25/22 2017 (!) 22      Temp 04/25/22 2017 98.2 F (36.8 C)     Temp Source 04/25/22 2017 Oral     SpO2 04/25/22 2017 100 %     Weight 04/25/22 2017 180 lb (81.6 kg)     Height 04/25/22 2017 5\' 3"  (1.6 m)   Constitutional: Alert and oriented. Well appearing and in no acute distress. Eyes: Conjunctivae are normal.  Head: Atraumatic. Nose: No congestion/rhinnorhea. Mouth/Throat: Mucous membranes are moist.   Neck: No stridor.   Cardiovascular: Normal rate, regular rhythm. Good peripheral circulation. Grossly normal heart sounds.   Respiratory: Normal respiratory effort.  No retractions. Lungs CTAB. Gastrointestinal: Soft and nontender. No distention.  Musculoskeletal: No lower extremity tenderness nor edema. No gross deformities of extremities. Neurologic:  Normal speech and language. No gross focal neurologic deficits are appreciated.  Skin:  Skin is warm, dry and intact. No rash noted.  ____________________________________________   LABS (all labs ordered are listed, but only abnormal results are displayed)  Labs Reviewed  BASIC METABOLIC PANEL - Abnormal; Notable for the following components:      Result Value   Glucose, Bld 132 (*)    All other components within normal limits  CBC - Abnormal; Notable for the following components:   WBC 11.8 (*)    All other components within normal limits  HCG, SERUM, QUALITATIVE  TROPONIN I (HIGH SENSITIVITY)  TROPONIN I (HIGH SENSITIVITY)   ____________________________________________  EKG  EKG Interpretation  Date/Time:  Monday April 25 2022 20:20:34 EDT Ventricular Rate:  86 PR Interval:  150 QRS Duration: 94 QT Interval:  386 QTC Calculation: 461 R Axis:   3 Text Interpretation: Normal sinus rhythm Minimal voltage criteria for LVH, may be normal variant ( Cornell product ) Nonspecific ST abnormality Abnormal ECG When compared with ECG of 09-Dec-2021 14:57, PREVIOUS ECG IS PRESENT Unchanged from January 2023 tracing Confirmed by Alona Bene [id number])  on 04/25/2022 8:33:10 PM        ____________________________________________  RADIOLOGY  DG Chest 2 View  Result Date: 04/25/2022 CLINICAL DATA:  Chest pain EXAM: CHEST - 2 VIEW COMPARISON:  12/09/2021 FINDINGS: The heart size and mediastinal contours are within normal limits. Both lungs are clear. The visualized skeletal structures are unremarkable. IMPRESSION: No active cardiopulmonary disease. Electronically Signed   By: Alcide Clever M.D.   On: 04/25/2022 20:55    ____________________________________________   PROCEDURES  Procedure(s) performed:   Procedures  None ____________________________________________   INITIAL IMPRESSION / ASSESSMENT AND PLAN / ED COURSE  Pertinent labs & imaging results that were available during my care of the patient were reviewed by me and considered in my medical decision making (see chart for details).   This patient is Presenting for Evaluation of CP, which does require a range of treatment options, and is a complaint that involves a high risk of morbidity and mortality.  The Differential Diagnoses  includes but is not exclusive to acute coronary syndrome, aortic dissection, pulmonary embolism, cardiac tamponade, community-acquired pneumonia, pericarditis, musculoskeletal chest wall pain, etc.   Critical Interventions-    Medications  sodium chloride 0.9 % bolus 500 mL (0 mLs Intravenous Stopped 04/25/22 2318)  ibuprofen (ADVIL) 100 MG/5ML suspension 600 mg (600 mg Oral Given 04/25/22 2156)    Reassessment after intervention: Symptoms improved.    I did obtain Additional Historical Information from husband at bedside.   I decided to review pertinent External Data, and in summary patient seen by [facility] on 5/30. No mention of PAF in that note. Plan is for outpatient coronary CT and reassess.    Clinical Laboratory Tests Ordered, included troponin negative. No AKI. Mild leukocytosis. No anemia.   Radiologic Tests Ordered,  included CXR. I independently interpreted the images and agree with radiology interpretation.   Cardiac Monitor Tracing which shows NSR. No ectopy or a fib.    Social Determinants of Health Risk patient is a smoker.   Medical Decision Making: Summary:  Patient returns to the ED with CP and palpitations. No acute distress. NSR here. Ischemic workup is reassuring. No PAF. Unclear if patient has this as an diagnosis or she suspects it. Not mentioned clearly in most recent Cardiology notes.   Reevaluation with update and discussion with patient and husband. Workup here is reassuring. Will continue home medications and reach out to Cardiology service for follow up.   Considered admission ACS workup is reassuring. Patient with close follow up with Cardiology and outpatient plan to address symptoms. Stable for d/c.   Disposition: discharge  ____________________________________________  FINAL CLINICAL IMPRESSION(S) / ED DIAGNOSES  Final diagnoses:  Precordial chest pain  Palpitations    Note:  This document was prepared using Dragon voice recognition software and may include unintentional dictation errors.  Alona Bene, MD, [facility]    Ayushi Pla, Arlyss Repress, MD 04/26/22 [phone]

## 2022-04-25 NOTE — ED Triage Notes (Signed)
Reported has had a headache and feeling unwell all day; now has chest, back and shoulder tightness; reported hx of Afib.

## 2022-04-25 NOTE — Discharge Instructions (Signed)

## 2022-04-27 ENCOUNTER — Emergency Department (HOSPITAL_BASED_OUTPATIENT_CLINIC_OR_DEPARTMENT_OTHER): Payer: No Typology Code available for payment source

## 2022-04-27 ENCOUNTER — Emergency Department (HOSPITAL_BASED_OUTPATIENT_CLINIC_OR_DEPARTMENT_OTHER)
Admission: EM | Admit: 2022-04-27 | Discharge: 2022-04-27 | Disposition: A | Discharge status: Home / Self Care | Payer: No Typology Code available for payment source | Attending: Emergency Medicine | Admitting: Emergency Medicine

## 2022-04-27 ENCOUNTER — Other Ambulatory Visit: Payer: Self-pay

## 2022-04-27 ENCOUNTER — Encounter (HOSPITAL_BASED_OUTPATIENT_CLINIC_OR_DEPARTMENT_OTHER): Payer: Self-pay | Admitting: Emergency Medicine

## 2022-04-27 DIAGNOSIS — R0789 Other chest pain: Secondary | ICD-10-CM | POA: Diagnosis not present

## 2022-04-27 DIAGNOSIS — R059 Cough, unspecified: Secondary | ICD-10-CM | POA: Diagnosis not present

## 2022-04-27 HISTORY — DX: Unspecified atrial fibrillation: I48.91

## 2022-04-27 LAB — PREGNANCY, URINE: Preg Test, Ur: NEGATIVE

## 2022-04-27 LAB — TROPONIN I (HIGH SENSITIVITY)
Troponin I (High Sensitivity): 2 ng/L (ref <18)
Troponin I (High Sensitivity): <2 ng/L (ref <18)

## 2022-04-27 LAB — CBC
HCT: 39.4 % (ref 36.0–46.0)
Hemoglobin: 13.5 g/dL (ref 12.0–15.0)
MCH: 29.5 pg (ref 26.0–34.0)
MCHC: 34.3 g/dL (ref 30.0–36.0)
MCV: 86 fL (ref 80.0–100.0)
Platelets: 299 10*3/uL (ref 150–400)
RBC: 4.58 MIL/uL (ref 3.87–5.11)
RDW: 13.7 % (ref 11.5–15.5)
WBC: 12.2 10*3/uL — ABNORMAL HIGH (ref 4.0–10.5)
nRBC: 0 % (ref 0.0–0.2)

## 2022-04-27 LAB — BASIC METABOLIC PANEL
Anion gap: 8 (ref 5–15)
BUN: 11 mg/dL (ref 6–20)
CO2: 24 mmol/L (ref 22–32)
Calcium: 9.4 mg/dL (ref 8.9–10.3)
Chloride: 105 mmol/L (ref 98–111)
Creatinine, Ser: 0.67 mg/dL (ref 0.44–1.00)
GFR, Estimated: >60 mL/min (ref >60)
Glucose, Bld: 114 mg/dL — ABNORMAL HIGH (ref 70–99)
Potassium: 3.6 mmol/L (ref 3.5–5.1)
Sodium: 137 mmol/L (ref 135–145)

## 2022-04-27 MED ORDER — ALBUTEROL SULFATE HFA 108 (90 BASE) MCG/ACT IN AERS
1.0000 | INHALATION_SPRAY | Freq: Once | RESPIRATORY_TRACT | Status: AC
  Administered 2022-04-27: 2 via RESPIRATORY_TRACT
  Filled 2022-04-27: qty 6.7

## 2022-04-27 MED ORDER — IOHEXOL 350 MG/ML SOLN
75.0000 mL | Freq: Once | INTRAVENOUS | Status: AC | PRN
  Administered 2022-04-27: 75 mL via INTRAVENOUS

## 2022-04-27 MED ORDER — MORPHINE SULFATE (PF) 4 MG/ML IV SOLN
4.0000 mg | Freq: Once | INTRAVENOUS | Status: DC
  Filled 2022-04-27: qty 1

## 2022-04-27 NOTE — Discharge Instructions (Addendum)
It was a pleasure taking care of you today!   Your workup was negative in the ED. your CTA chest did not show any acute abnormalities.  You have been given an albuterol inhaler, you may use an up to 1-2 puffs as needed every 6 hours.  It is important that you call your cardiologist today to set up a follow-up appointment closer than your appointment on 05/06/2022.  Follow-up with your primary care provider for evaluation of your symptoms. You may return to the ED if you are experiencing increasing/worsening chest pain, shortness of breath, or worsening symptoms.

## 2022-04-27 NOTE — ED Triage Notes (Signed)
Pt reports chest tightness that started 10 minutes ago; sts she feels like she's going to pass out; hx afib

## 2022-04-27 NOTE — ED Notes (Signed)
ED Provider at bedside. 

## 2022-04-27 NOTE — ED Provider Notes (Signed)
[place] [facility] EMERGENCY DEPARTMENT Provider Note   CSN: [id number] Arrival date & time: 04/27/22  1022     History  Chief Complaint  Patient presents with   Chest Pain    Teresa Ochoa is a 41 y.o. female with a past medical history of PAF, who presents to the emergency department with concerns for chest tightness onset 10 minutes ago.  Has associated productive cough.  Patient notes she is compliant with her diltiazem.  Patient notes that she did not call her cardiologist after she was evaluated in the emergency department on 04/25/2022 because she already has an appoint with them on 05/06/2022.  No management prior to arrival.  Denies fever, chills, nausea, vomiting, shortness of breath.  Denies past medical history of MI, hypertension, diabetes, CAD, cardiac catheterization, stents.    Per patient chart review: Patient was evaluated in the emergency department for chest pain on 04/25/2022.  Patient had a negative work-up in the ED and was advised to call her cardiologist on 6/6 for follow-up appointment regarding today's ED visit.     The history is provided by the patient. No language interpreter was used.      Home Medications Prior to Admission medications   Medication Sig Start Date End Date Taking? Authorizing Provider  albuterol (VENTOLIN HFA) 108 (90 Base) MCG/ACT inhaler Inhale into the lungs. 06/02/21   [provider]  albuterol (VENTOLIN HFA) 108 (90 Base) MCG/ACT inhaler Inhale into the lungs every 6 (six) hours as needed for wheezing or shortness of breath.    [provider]  buprenorphine (SUBUTEX) 8 MG SUBL SL tablet Place 8 mg under the tongue daily.    [provider]  diltiazem (CARDIZEM) 30 MG tablet Take 1 tablet (30 mg total) by mouth every 6 (six) hours as needed. Take as needed for heart palpitations 03/02/21   Rudean Haskell A, MD  diltiazem (CARDIZEM) 30 MG tablet Take by mouth. 11/09/21   [provider]   furosemide (LASIX) 20 MG tablet Take by mouth as needed. 06/29/21   [provider]  furosemide (LASIX) 20 MG tablet Take 20 mg by mouth.    [provider]  lactulose (CHRONULAC) 10 GM/15ML solution Take by mouth in the morning and at bedtime. Patient not taking: Reported on 12/16/2021 05/29/20   [provider]  LINZESS 145 MCG CAPS capsule Take 145 mcg by mouth daily. 01/05/22   [provider]  montelukast (SINGULAIR) 10 MG tablet Take 1 tablet by mouth at bedtime. Patient not taking: Reported on 12/16/2021 09/03/20   [provider]  ondansetron (ZOFRAN-ODT) 4 MG disintegrating tablet Take 4 mg by mouth every 8 (eight) hours as needed. Patient not taking: Reported on 12/16/2021 12/04/20   [provider]  rosuvastatin (CRESTOR) 10 MG tablet Take 1 tablet (10 mg total) by mouth daily. 03/01/21   Chandrasekhar, Lyda Kalata A, MD  rosuvastatin (CRESTOR) 10 MG tablet Take 10 mg by mouth at bedtime. 01/05/22   [provider]      Allergies    Ketorolac, Macrobid [nitrofurantoin monohyd macro], Other, Compazine [prochlorperazine], Macrobid [nitrofurantoin], Reglan [metoclopramide], Toradol [ketorolac tromethamine], Tylenol [acetaminophen], Tylenol [acetaminophen], and Compazine    Review of Systems   Review of Systems  Constitutional:  Negative for chills and fever.  Respiratory:  Positive for cough and chest tightness. Negative for shortness of breath.   Cardiovascular:  Positive for chest pain.  Gastrointestinal:  Negative for nausea and vomiting.  All other systems reviewed  and are negative.  Physical Exam Updated Vital Signs BP (!) 162/89   Pulse 93   Temp 98 F (36.7 C) (Oral)   Resp 20   Ht 5\' 4"  (1.626 m)   Wt 83.9 kg   SpO2 100%   BMI 31.76 kg/m  Physical Exam Vitals and nursing note reviewed.  Constitutional:      General: She is not in acute distress.    Appearance: She is not diaphoretic.  HENT:     Head:  Normocephalic and atraumatic.     Mouth/Throat:     Pharynx: No oropharyngeal exudate.  Eyes:     General: No scleral icterus.    Conjunctiva/sclera: Conjunctivae normal.  Cardiovascular:     Rate and Rhythm: Normal rate and regular rhythm.     Pulses: Normal pulses.     Heart sounds: Normal heart sounds.  Pulmonary:     Effort: Pulmonary effort is normal. No respiratory distress.     Breath sounds: Normal breath sounds. No wheezing.  Abdominal:     General: Bowel sounds are normal.     Palpations: Abdomen is soft. There is no mass.     Tenderness: There is no abdominal tenderness. There is no guarding or rebound.  Musculoskeletal:        General: Normal range of motion.     Cervical back: Normal range of motion and neck supple.  Skin:    General: Skin is warm and dry.  Neurological:     Mental Status: She is alert.  Psychiatric:        Behavior: Behavior normal.    ED Results / Procedures / Treatments   Labs (all labs ordered are listed, but only abnormal results are displayed) Labs Reviewed  BASIC METABOLIC PANEL - Abnormal; Notable for the following components:      Result Value   Glucose, Bld 114 (*)    All other components within normal limits  CBC - Abnormal; Notable for the following components:   WBC 12.2 (*)    All other components within normal limits  PREGNANCY, URINE  TROPONIN I (HIGH SENSITIVITY)  TROPONIN I (HIGH SENSITIVITY)    EKG EKG Interpretation  Date/Time:  Wednesday April 27 2022 10:30:52 EDT Ventricular Rate:  86 PR Interval:  155 QRS Duration: 101 QT Interval:  382 QTC Calculation: 457 R Axis:   4 Text Interpretation: Sinus rhythm No significant change since last tracing Confirmed by Isla Pence [phone]) on 04/27/2022 11:15:01 AM  Radiology DG Chest 2 View  Result Date: 04/27/2022 CLINICAL DATA:  Chest tightness. EXAM: CHEST - 2 VIEW COMPARISON:  04/25/2022 FINDINGS: The heart size and mediastinal contours are within normal limits.  Stable mild chronic bronchovascular prominence in the lungs. There is no evidence of pulmonary edema, consolidation, pneumothorax, nodule or pleural fluid. The visualized skeletal structures are unremarkable. IMPRESSION: No active cardiopulmonary disease. Stable mild bronchovascular prominence is reflective of some degree of chronic lung disease. Electronically Signed   By: Aletta Edouard M.D.   On: 04/27/2022 10:56   DG Chest 2 View  Result Date: 04/25/2022 CLINICAL DATA:  Chest pain EXAM: CHEST - 2 VIEW COMPARISON:  12/09/2021 FINDINGS: The heart size and mediastinal contours are within normal limits. Both lungs are clear. The visualized skeletal structures are unremarkable. IMPRESSION: No active cardiopulmonary disease. Electronically Signed   By: Inez Catalina M.D.   On: 04/25/2022 20:55   CT Angio Chest PE W and/or Wo Contrast  Result Date: 04/27/2022 CLINICAL DATA:  Pulmonary  embolism (PE) suspected, high prob EXAM: CT ANGIOGRAPHY CHEST WITH CONTRAST TECHNIQUE: Multidetector CT imaging of the chest was performed using the standard protocol during bolus administration of intravenous contrast. Multiplanar CT image reconstructions and MIPs were obtained to evaluate the vascular anatomy. RADIATION DOSE REDUCTION: This exam was performed according to the departmental dose-optimization program which includes automated exposure control, adjustment of the mA and/or kV according to patient size and/or use of iterative reconstruction technique. CONTRAST:  37mL OMNIPAQUE IOHEXOL 350 MG/ML SOLN COMPARISON:  Chest XR, concurrent.  CT chest, 01/26/2022. FINDINGS: Cardiovascular: Satisfactory opacification of the pulmonary arteries to the segmental level. No evidence of pulmonary embolism. Normal heart size. No pericardial effusion. Mediastinum/Nodes: Small volume tissue at the anterior mediastinum. No enlarged mediastinal, hilar, or axillary lymph nodes. Thyroid gland, trachea, and esophagus demonstrate no significant  findings. Lungs/Pleura: Lungs are clear. No suspicious pulmonary nodule or mass. No pleural effusion or pneumothorax. Upper Abdomen: No acute abnormality.  Cholecystectomy Musculoskeletal: No chest wall abnormality. No acute osseous findings. Review of the MIP images confirms the above findings. IMPRESSION: 1. No segmental or larger pulmonary embolus 2. No acute intrathoracic findings 3. Small volume of tissue at the anterior mediastinum, likely residual thymus Electronically Signed   By: Roanna Banning M.D.   On: 04/27/2022 12:41    Procedures Procedures    Medications Ordered in ED Medications  morphine (PF) 4 MG/ML injection 4 mg (4 mg Intravenous Not Given 04/27/22 1210)  albuterol (VENTOLIN HFA) 108 (90 Base) MCG/ACT inhaler 1-2 puff (2 puffs Inhalation Given 04/27/22 1154)  iohexol (OMNIPAQUE) 350 MG/ML injection 75 mL (75 mLs Intravenous Contrast Given 04/27/22 1223)    ED Course/ Medical Decision Making/ A&P Clinical Course as of 04/27/22 1404  Wed Apr 27, 2022  1303 Patient reevaluated and resting comfortably on the stretcher and noted improvement of her symptoms with albuterol in the ED.  Patient that she declined morphine at this time as she does not take narcotics.  Discussed with patient lab and imaging findings.  Discussed with patient pending troponin.  Answered all available questions. [SB]  1341 Reevaluated and noted improvement of symptoms in the ED.  Discussed with patient remainder of lab findings.  Also discussed discharge treatment plan.  Patient appears safe for discharge at this time. [SB]    Clinical Course User Index [SB] Ardean Simonich A, PA-C                           Medical Decision Making Amount and/or Complexity of Data Reviewed Labs: ordered. Radiology: ordered.  Risk Prescription drug management.   Patient presents to the ED with chest tightness onset 10 minutes ago PTA. Has a history of PAF. Denies past medical history of MI, hypertension, diabetes, CAD,  cardiac catheterization, stents. Vitals stable, pt afebrile, not tachycardic or hypoxic. On exam patient without acute cardiovascular, respiratory, or abdominal exam findings. Differential diagnosis includes ACS, aortic dissection, pneumothorax, PE, PNA.    Additional history obtained:  External records from outside source obtained and reviewed including: Patient was evaluated in the emergency department for chest pain on 04/25/2022.  Patient had a negative work-up in the ED and was advised to call her cardiologist, Dr. Axel Filler [facility]) on 6/6 for follow-up appointment regarding today's ED visit.  EKG:  Normal sinus rhythm.  Labs:  I ordered, and personally interpreted labs.  The pertinent results include:   Initial troponin at  2 and delta troponin <2 CBC with elevated  WBC of 12.2 otherwise unremarkable BMP with slightly elevated glucose at 114 otherwise unremarkable. Urine pregnancy negative  Imaging: I ordered imaging studies including CXR, CTA chest I independently visualized and interpreted imaging which showed:  No active cardiopulmonary disease. Stable mild bronchovascular  prominence is reflective of some degree of chronic lung disease.  CTA chest:  1. No segmental or larger pulmonary embolus  2. No acute intrathoracic findings  3. Small volume of tissue at the anterior mediastinum, likely  residual thymus   I agree with the radiologist interpretation  Medications:  I ordered medication including albuterol inhaler for symptom management.  Reevaluation of the patient after these medicines and interventions, I reevaluated the patient and found that they have improved I have reviewed the patients home medicines and have made adjustments as needed   Cardiac Monitoring: The patient was maintained on a cardiac monitor.  I personally viewed and interpreted the cardiac monitored which showed an underlying rhythm of: Sinus rhythm.     Disposition: Presentation suspicious for  atypical chest pain. EKG without acute ST/T changes, troponins negative, chest x-ray negative, low suspicion for ACS at this time. Chest x-ray without acute findings, vital signs stable, doubt pneumonia or pneumothorax at this time.  CTA chest without acute findings for PE at this time. Heart score, patient at low risk. Case discussed with attending who agrees with discharge treatment plan at this time. After consideration of the diagnostic results and the patients response to treatment, I feel that the patient would benefit from Discharge home.  Discussed with patient importance of calling her cardiologist today to set up a closer follow-up appointment than the one that she has scheduled on 05/06/2022.  Work note provided.  Supportive care measures and strict return precautions discussed with patient at bedside. Pt acknowledges and verbalizes understanding. Pt appears safe for discharge. Follow up as indicated in discharge paperwork.   This chart was dictated using voice recognition software, Dragon. Despite the best efforts of this provider to proofread and correct errors, errors may still occur which can change documentation meaning.   Final Clinical Impression(s) / ED Diagnoses Final diagnoses:  Atypical chest pain    Rx / DC Orders ED Discharge Orders     None         Marcellis Frampton A, PA-C 04/27/22 1405    Isla Pence, MD 04/28/22 [phone]

## 2022-05-20 DIAGNOSIS — R69 Illness, unspecified: Secondary | ICD-10-CM | POA: Diagnosis not present

## 2022-05-20 DIAGNOSIS — F1120 Opioid dependence, uncomplicated: Secondary | ICD-10-CM | POA: Diagnosis not present

## 2022-06-04 DIAGNOSIS — R079 Chest pain, unspecified: Secondary | ICD-10-CM | POA: Diagnosis not present

## 2022-06-04 DIAGNOSIS — I10 Essential (primary) hypertension: Secondary | ICD-10-CM | POA: Diagnosis not present

## 2022-06-04 DIAGNOSIS — Z9089 Acquired absence of other organs: Secondary | ICD-10-CM | POA: Diagnosis not present

## 2022-06-04 DIAGNOSIS — I480 Paroxysmal atrial fibrillation: Secondary | ICD-10-CM | POA: Diagnosis not present

## 2022-06-04 DIAGNOSIS — R9431 Abnormal electrocardiogram [ECG] [EKG]: Secondary | ICD-10-CM | POA: Diagnosis not present

## 2022-06-04 DIAGNOSIS — Z888 Allergy status to other drugs, medicaments and biological substances status: Secondary | ICD-10-CM | POA: Diagnosis not present

## 2022-06-04 DIAGNOSIS — Z885 Allergy status to narcotic agent status: Secondary | ICD-10-CM | POA: Diagnosis not present

## 2022-06-04 DIAGNOSIS — Z79890 Hormone replacement therapy (postmenopausal): Secondary | ICD-10-CM | POA: Diagnosis not present

## 2022-06-04 DIAGNOSIS — Z87891 Personal history of nicotine dependence: Secondary | ICD-10-CM | POA: Diagnosis not present

## 2022-06-04 DIAGNOSIS — Z9049 Acquired absence of other specified parts of digestive tract: Secondary | ICD-10-CM | POA: Diagnosis not present

## 2022-06-04 DIAGNOSIS — Z79899 Other long term (current) drug therapy: Secondary | ICD-10-CM | POA: Diagnosis not present

## 2022-06-04 DIAGNOSIS — R002 Palpitations: Secondary | ICD-10-CM | POA: Diagnosis not present

## 2022-06-04 DIAGNOSIS — R42 Dizziness and giddiness: Secondary | ICD-10-CM | POA: Diagnosis not present

## 2022-06-06 ENCOUNTER — Encounter (HOSPITAL_BASED_OUTPATIENT_CLINIC_OR_DEPARTMENT_OTHER): Payer: Self-pay

## 2022-06-06 ENCOUNTER — Emergency Department (HOSPITAL_BASED_OUTPATIENT_CLINIC_OR_DEPARTMENT_OTHER): Payer: No Typology Code available for payment source

## 2022-06-06 ENCOUNTER — Emergency Department (HOSPITAL_BASED_OUTPATIENT_CLINIC_OR_DEPARTMENT_OTHER)
Admission: EM | Admit: 2022-06-06 | Discharge: 2022-06-06 | Disposition: A | Discharge status: Home / Self Care | Payer: No Typology Code available for payment source | Attending: Emergency Medicine | Admitting: Emergency Medicine

## 2022-06-06 ENCOUNTER — Other Ambulatory Visit: Payer: Self-pay

## 2022-06-06 DIAGNOSIS — K760 Fatty (change of) liver, not elsewhere classified: Secondary | ICD-10-CM | POA: Diagnosis not present

## 2022-06-06 DIAGNOSIS — R0789 Other chest pain: Secondary | ICD-10-CM | POA: Diagnosis not present

## 2022-06-06 DIAGNOSIS — R072 Precordial pain: Secondary | ICD-10-CM | POA: Insufficient documentation

## 2022-06-06 DIAGNOSIS — Z7951 Long term (current) use of inhaled steroids: Secondary | ICD-10-CM | POA: Diagnosis not present

## 2022-06-06 DIAGNOSIS — J90 Pleural effusion, not elsewhere classified: Secondary | ICD-10-CM | POA: Diagnosis not present

## 2022-06-06 DIAGNOSIS — R079 Chest pain, unspecified: Secondary | ICD-10-CM | POA: Diagnosis not present

## 2022-06-06 DIAGNOSIS — R7989 Other specified abnormal findings of blood chemistry: Secondary | ICD-10-CM | POA: Diagnosis not present

## 2022-06-06 DIAGNOSIS — R0602 Shortness of breath: Secondary | ICD-10-CM | POA: Insufficient documentation

## 2022-06-06 LAB — CBC WITH DIFFERENTIAL/PLATELET
Abs Immature Granulocytes: 0.05 10*3/uL (ref 0.00–0.07)
Basophils Absolute: 0.1 10*3/uL (ref 0.0–0.1)
Basophils Relative: 1 %
Eosinophils Absolute: 0.1 10*3/uL (ref 0.0–0.5)
Eosinophils Relative: 1 %
HCT: 38.9 % (ref 36.0–46.0)
Hemoglobin: 13.3 g/dL (ref 12.0–15.0)
Immature Granulocytes: 1 %
Lymphocytes Relative: 15 %
Lymphs Abs: 1.5 10*3/uL (ref 0.7–4.0)
MCH: 29.8 pg (ref 26.0–34.0)
MCHC: 34.2 g/dL (ref 30.0–36.0)
MCV: 87 fL (ref 80.0–100.0)
Monocytes Absolute: 0.4 10*3/uL (ref 0.1–1.0)
Monocytes Relative: 4 %
Neutro Abs: 8.2 10*3/uL — ABNORMAL HIGH (ref 1.7–7.7)
Neutrophils Relative %: 78 %
Platelets: 316 10*3/uL (ref 150–400)
RBC: 4.47 MIL/uL (ref 3.87–5.11)
RDW: 13.2 % (ref 11.5–15.5)
WBC: 10.3 10*3/uL (ref 4.0–10.5)
nRBC: 0 % (ref 0.0–0.2)

## 2022-06-06 LAB — BASIC METABOLIC PANEL
Anion gap: 7 (ref 5–15)
BUN: 8 mg/dL (ref 6–20)
CO2: 26 mmol/L (ref 22–32)
Calcium: 9.3 mg/dL (ref 8.9–10.3)
Chloride: 106 mmol/L (ref 98–111)
Creatinine, Ser: 0.73 mg/dL (ref 0.44–1.00)
GFR, Estimated: >60 mL/min (ref >60)
Glucose, Bld: 140 mg/dL — ABNORMAL HIGH (ref 70–99)
Potassium: 3.7 mmol/L (ref 3.5–5.1)
Sodium: 139 mmol/L (ref 135–145)

## 2022-06-06 LAB — HCG, SERUM, QUALITATIVE: Preg, Serum: NEGATIVE

## 2022-06-06 LAB — D-DIMER, QUANTITATIVE: D-Dimer, Quant: 0.58 ug/mL-FEU — ABNORMAL HIGH (ref 0.00–0.50)

## 2022-06-06 LAB — TROPONIN I (HIGH SENSITIVITY): Troponin I (High Sensitivity): 3 ng/L (ref <18)

## 2022-06-06 MED ORDER — IOHEXOL 350 MG/ML SOLN
100.0000 mL | Freq: Once | INTRAVENOUS | Status: AC | PRN
  Administered 2022-06-06: 75 mL via INTRAVENOUS

## 2022-06-06 NOTE — ED Notes (Signed)
Reviewed discharge instructions and recommendations with pt. States understanding. Pt discharged via W/C with family member

## 2022-06-06 NOTE — ED Notes (Signed)
Patient transported to X-ray 

## 2022-06-06 NOTE — ED Triage Notes (Signed)
Pt reports left chest pressure and pain just below left shoulder since Saturday. Went to another hospital on Saturday and told in afib. Given ativan. Reports dizziness and shaky. Reports very anxious

## 2022-06-06 NOTE — Discharge Instructions (Signed)
You came to the emergency department today to be evaluated for your chest pain.  The CT scan of your chest did not show any signs of blood clots.  Your troponin was normal and therefore there is a low suspicion you are having a heart attack today.  Please follow-up with your cardiologist for further management of your chest pain.  Please follow-up with your primary care doctor for further management of your tremors.  Get help right away if: Your chest pain gets worse. You have a cough that gets worse, or you cough up blood. You have severe pain in your abdomen. You faint. You have sudden, unexplained chest discomfort. You have sudden, unexplained discomfort in your arms, back, neck, or jaw. You have shortness of breath at any time. You suddenly start to sweat, or your skin gets clammy. You feel nausea or you vomit. You suddenly feel lightheaded or dizzy. You have severe weakness, or unexplained weakness or fatigue. Your heart begins to beat quickly, or it feels like it is skipping beats.

## 2022-06-06 NOTE — ED Provider Notes (Signed)
[facility] EMERGENCY DEPARTMENT Provider Note   CSN: [id number] Arrival date & time: 06/06/22  1017     History  Chief Complaint  Patient presents with   Chest Pain    Teresa Ochoa is a 41 y.o. female with past medical history of atrial fibrillation (not on anticoagulation), substance use, MS, chronic pain syndrome.  Presents to the emergency department the chief complaint of chest pain.  Patient reports that she started having chest pain on Friday.  Pain has been constant since then.  Pain is located to her left upper chest, underneath her right breast, and to her right shoulder blade.  Patient describes pain to right breast and right shoulder blade as a pressure.  Rates pressure 4/10 on pain scale.  Patient reports pain to the left side of her chest as "painful."  Patient is unable to give further information on what that chest pain specifically feels like. Patient denies any aggravating or alleviating factors.  Patient does endorse associated shortness of breath with her symptoms.  Patient states that she has had palpitations intermittently since Friday.  No palpitations at present  Patient also reports that she has had episodes of tremors throughout her entire body over the weekend.  Patient denies any syncope or confusion after these episodes.  Patient does not have any history of seizures.  Patient denies any fever, chills, nausea, vomiting, diaphoresis, leg swelling or tenderness, hemoptysis, family history of cardiac activity under the age of 82, surgery in the last 4 weeks, hormone therapy, history of malignancy.   Chest Pain Associated symptoms: palpitations and shortness of breath   Associated symptoms: no abdominal pain, no back pain, no dizziness, no fever, no headache, no nausea and no vomiting        Home Medications Prior to Admission medications   Medication Sig Start Date End Date Taking? Authorizing Provider  albuterol (VENTOLIN HFA) 108 (90 Base)  MCG/ACT inhaler Inhale into the lungs. 06/02/21   [provider]  albuterol (VENTOLIN HFA) 108 (90 Base) MCG/ACT inhaler Inhale into the lungs every 6 (six) hours as needed for wheezing or shortness of breath.    [provider]  buprenorphine (SUBUTEX) 8 MG SUBL SL tablet Place 8 mg under the tongue daily.    [provider]  diltiazem (CARDIZEM) 30 MG tablet Take 1 tablet (30 mg total) by mouth every 6 (six) hours as needed. Take as needed for heart palpitations 03/02/21   Riley Lam A, MD  diltiazem (CARDIZEM) 30 MG tablet Take by mouth. 11/09/21   [provider]  furosemide (LASIX) 20 MG tablet Take by mouth as needed. 06/29/21   [provider]  furosemide (LASIX) 20 MG tablet Take 20 mg by mouth.    [provider]  lactulose (CHRONULAC) 10 GM/15ML solution Take by mouth in the morning and at bedtime. Patient not taking: Reported on 12/16/2021 05/29/20   [provider]  LINZESS 145 MCG CAPS capsule Take 145 mcg by mouth daily. 01/05/22   [provider]  montelukast (SINGULAIR) 10 MG tablet Take 1 tablet by mouth at bedtime. Patient not taking: Reported on 12/16/2021 09/03/20   [provider]  ondansetron (ZOFRAN-ODT) 4 MG disintegrating tablet Take 4 mg by mouth every 8 (eight) hours as needed. Patient not taking: Reported on 12/16/2021 12/04/20   [provider]  rosuvastatin (CRESTOR) 10 MG tablet Take 1 tablet (10 mg total) by mouth daily. 03/01/21   Christell Constant, MD  rosuvastatin (  CRESTOR) 10 MG tablet Take 10 mg by mouth at bedtime. 01/05/22   [provider]      Allergies    Ketorolac, Macrobid [nitrofurantoin monohyd macro], Other, Compazine [prochlorperazine], Macrobid [nitrofurantoin], Reglan [metoclopramide], Toradol [ketorolac tromethamine], Tylenol [acetaminophen], Tylenol [acetaminophen], and Compazine    Review of Systems   Review of Systems  Constitutional:   Negative for chills and fever.  Eyes:  Negative for visual disturbance.  Respiratory:  Positive for shortness of breath.   Cardiovascular:  Positive for chest pain and palpitations. Negative for leg swelling.  Gastrointestinal:  Negative for abdominal pain, nausea and vomiting.  Genitourinary:  Negative for difficulty urinating and dysuria.  Musculoskeletal:  Negative for back pain and neck pain.  Skin:  Negative for color change and rash.  Neurological:  Positive for tremors. Negative for dizziness, seizures, syncope, light-headedness and headaches.  Psychiatric/Behavioral:  Negative for confusion.     Physical Exam Updated Vital Signs Pulse (!) 105   Temp 98.7 F (37.1 C)   Resp 15   Ht 5\' 4"  (1.626 m)   Wt 83.9 kg   SpO2 96%   BMI 31.76 kg/m  Physical Exam Vitals and nursing note reviewed.  Constitutional:      General: She is not in acute distress.    Appearance: She is not ill-appearing, toxic-appearing or diaphoretic.  HENT:     Head: Normocephalic.  Eyes:     General: No scleral icterus.       Right eye: No discharge.        Left eye: No discharge.  Cardiovascular:     Rate and Rhythm: Normal rate.     Pulses:          Radial pulses are 2+ on the right side and 2+ on the left side.  Pulmonary:     Effort: Pulmonary effort is normal. No tachypnea, bradypnea or respiratory distress.     Breath sounds: Normal breath sounds. No stridor.     Comments: Speaks in full complete sentences without difficulty Chest:     Chest wall: No tenderness.  Musculoskeletal:     Right lower leg: No swelling, deformity, lacerations, tenderness or bony tenderness. No edema.     Left lower leg: No swelling, deformity, lacerations, tenderness or bony tenderness. No edema.  Skin:    General: Skin is warm and dry.  Neurological:     General: No focal deficit present.     Mental Status: She is alert.  Psychiatric:        Behavior: Behavior is cooperative.     ED Results /  Procedures / Treatments   Labs (all labs ordered are listed, but only abnormal results are displayed) Labs Reviewed  BASIC METABOLIC PANEL - Abnormal; Notable for the following components:      Result Value   Glucose, Bld 140 (*)    All other components within normal limits  CBC WITH DIFFERENTIAL/PLATELET - Abnormal; Notable for the following components:   Neutro Abs 8.2 (*)    All other components within normal limits  D-DIMER, QUANTITATIVE - Abnormal; Notable for the following components:   D-Dimer, Quant 0.58 (*)    All other components within normal limits  HCG, SERUM, QUALITATIVE  TROPONIN I (HIGH SENSITIVITY)    EKG EKG Interpretation  Date/Time:  Monday June 06 2022 10:28:43 EDT Ventricular Rate:  96 PR Interval:  162 QRS Duration: 92 QT Interval:  360 QTC Calculation: 455 R Axis:   -8 Text Interpretation: Sinus rhythm  Baseline wander in lead(s) V2 V5 V6 No significant change since last tracing Confirmed by Melene Plan [phone]) on 06/06/2022 10:51:28 AM  Radiology DG Chest 2 View  Result Date: 06/06/2022 CLINICAL DATA:  Provided history: Chest pain. EXAM: CHEST - 2 VIEW COMPARISON:  CT angiogram chest 04/27/2022. Chest radiograph 04/27/2022. FINDINGS: Heart size within normal limits. No appreciable airspace consolidation. No evidence of pleural effusion or pneumothorax. No acute bony abnormality identified. IMPRESSION: No evidence of active cardiopulmonary disease. Electronically Signed   By: Jackey Loge D.O.   On: 06/06/2022 11:04    Procedures Procedures    Medications Ordered in ED Medications - No data to display  ED Course/ Medical Decision Making/ A&P                           Medical Decision Making Amount and/or Complexity of Data Reviewed Labs: ordered. Radiology: ordered.  Risk Prescription drug management.   Alert 41 year old female in no acute distress, nontoxic-appearing.  Presents emergency department complaint of chest pain.  Information was  obtained from patient.  I reviewed patient's past medical records including specialist notes, previous provider notes, labs, and imaging.  Patient has medical history as outlined in HPI which complicates her care.  Per chart review patient was seen at [facility] on 06/04/2022 for similar symptoms.  Patient had negative delta troponin at that visit.  With reports of chest pain differential diagnosis includes but is not limited to ACS, PE, musculoskeletal injury, pneumothorax.  Will obtain ACS work-up at this time.  Unable to rule out PE via [facility] criteria therefore will obtain D-dimer as patient is low risk for PE.  I personally viewed and interpreted patient's EKG.  Tracing shows sinus rhythm.  I personally viewed and interpreted patient's chest x-ray.  Imaging shows no active cardiopulmonary disease.  I personally viewed and interpreted patient's lab results.  Pertinent findings include: -BMP, CBC, and serum qualitative hCG are unremarkable -Troponin 3 -D-dimer elevated at 0.58  With elevated D-dimer will obtain CTA of chest to evaluate for possible PE.  Low suspicion for ACS as patient has had constant chest pain since Friday with negative troponin today and negative delta troponins on the 15th.  I personally viewed and interpreted patient CTA.  Agree with radiology interpretation of no evidence of PE.  Patient hemodynamically stable with resolution of her tachycardia.  We will discharge patient at this time to follow-up with her cardiologist.  Patient reports that she has an appointment with her cardiologist later this afternoon.  Additionally will have patient follow-up with her PCP due to reports of tremors.  Based on patient's chief complaint, I considered admission might be necessary, however after reassuring ED workup feel patient is reasonable for discharge.  Discussed results, findings, treatment and follow up. Patient advised of return precautions. Patient  verbalized understanding and agreed with plan.  Portions of this note were generated with Scientist, clinical (histocompatibility and immunogenetics). Dictation errors may occur despite best attempts at proofreading.         Final Clinical Impression(s) / ED Diagnoses Final diagnoses:  Precordial chest pain    Rx / DC Orders ED Discharge Orders     None         Haskel Schroeder, PA-C 06/06/22 1323    Melene Plan, DO 06/06/22 1408

## 2022-06-07 DIAGNOSIS — R0789 Other chest pain: Secondary | ICD-10-CM | POA: Diagnosis not present

## 2022-06-07 DIAGNOSIS — R079 Chest pain, unspecified: Secondary | ICD-10-CM | POA: Diagnosis not present

## 2022-06-07 DIAGNOSIS — R0602 Shortness of breath: Secondary | ICD-10-CM | POA: Diagnosis not present

## 2022-06-08 DIAGNOSIS — R519 Headache, unspecified: Secondary | ICD-10-CM | POA: Diagnosis not present

## 2022-06-08 DIAGNOSIS — H9313 Tinnitus, bilateral: Secondary | ICD-10-CM | POA: Diagnosis not present

## 2022-06-08 DIAGNOSIS — R42 Dizziness and giddiness: Secondary | ICD-10-CM | POA: Diagnosis not present

## 2022-06-12 ENCOUNTER — Emergency Department (HOSPITAL_COMMUNITY): Payer: 59

## 2022-06-12 ENCOUNTER — Other Ambulatory Visit: Payer: Self-pay

## 2022-06-12 ENCOUNTER — Emergency Department (HOSPITAL_COMMUNITY)
Admission: EM | Admit: 2022-06-12 | Discharge: 2022-06-13 | Disposition: A | Discharge status: Home / Self Care | Payer: 59 | Attending: Emergency Medicine | Admitting: Emergency Medicine

## 2022-06-12 ENCOUNTER — Encounter (HOSPITAL_COMMUNITY): Payer: Self-pay

## 2022-06-12 DIAGNOSIS — I10 Essential (primary) hypertension: Secondary | ICD-10-CM | POA: Diagnosis not present

## 2022-06-12 DIAGNOSIS — R569 Unspecified convulsions: Secondary | ICD-10-CM | POA: Insufficient documentation

## 2022-06-12 DIAGNOSIS — R079 Chest pain, unspecified: Secondary | ICD-10-CM | POA: Diagnosis not present

## 2022-06-12 DIAGNOSIS — R0789 Other chest pain: Secondary | ICD-10-CM | POA: Diagnosis not present

## 2022-06-12 LAB — URINALYSIS, ROUTINE W REFLEX MICROSCOPIC
Bacteria, UA: NONE SEEN
Bilirubin Urine: NEGATIVE
Glucose, UA: NEGATIVE mg/dL
Ketones, ur: NEGATIVE mg/dL
Leukocytes,Ua: NEGATIVE
Nitrite: NEGATIVE
Protein, ur: NEGATIVE mg/dL
Specific Gravity, Urine: 1.012 (ref 1.005–1.030)
pH: 6 (ref 5.0–8.0)

## 2022-06-12 LAB — COMPREHENSIVE METABOLIC PANEL
ALT: 14 U/L (ref 0–44)
AST: 18 U/L (ref 15–41)
Albumin: 4 g/dL (ref 3.5–5.0)
Alkaline Phosphatase: 56 U/L (ref 38–126)
Anion gap: 8 (ref 5–15)
BUN: 8 mg/dL (ref 6–20)
CO2: 26 mmol/L (ref 22–32)
Calcium: 8.7 mg/dL — ABNORMAL LOW (ref 8.9–10.3)
Chloride: 106 mmol/L (ref 98–111)
Creatinine, Ser: 0.78 mg/dL (ref 0.44–1.00)
GFR, Estimated: >60 mL/min (ref >60)
Glucose, Bld: 103 mg/dL — ABNORMAL HIGH (ref 70–99)
Potassium: 3.6 mmol/L (ref 3.5–5.1)
Sodium: 140 mmol/L (ref 135–145)
Total Bilirubin: 0.5 mg/dL (ref 0.3–1.2)
Total Protein: 6.5 g/dL (ref 6.5–8.1)

## 2022-06-12 LAB — CBC WITH DIFFERENTIAL/PLATELET
Abs Immature Granulocytes: 0.02 10*3/uL (ref 0.00–0.07)
Basophils Absolute: 0.1 10*3/uL (ref 0.0–0.1)
Basophils Relative: 1 %
Eosinophils Absolute: 0.1 10*3/uL (ref 0.0–0.5)
Eosinophils Relative: 1 %
HCT: 36.4 % (ref 36.0–46.0)
Hemoglobin: 11.9 g/dL — ABNORMAL LOW (ref 12.0–15.0)
Immature Granulocytes: 0 %
Lymphocytes Relative: 22 %
Lymphs Abs: 2.6 10*3/uL (ref 0.7–4.0)
MCH: 29.5 pg (ref 26.0–34.0)
MCHC: 32.7 g/dL (ref 30.0–36.0)
MCV: 90.3 fL (ref 80.0–100.0)
Monocytes Absolute: 0.7 10*3/uL (ref 0.1–1.0)
Monocytes Relative: 6 %
Neutro Abs: 8.2 10*3/uL — ABNORMAL HIGH (ref 1.7–7.7)
Neutrophils Relative %: 70 %
Platelets: 310 10*3/uL (ref 150–400)
RBC: 4.03 MIL/uL (ref 3.87–5.11)
RDW: 13 % (ref 11.5–15.5)
WBC: 11.7 10*3/uL — ABNORMAL HIGH (ref 4.0–10.5)
nRBC: 0 % (ref 0.0–0.2)

## 2022-06-12 LAB — RAPID URINE DRUG SCREEN, HOSP PERFORMED
Amphetamines: NOT DETECTED
Barbiturates: NOT DETECTED
Benzodiazepines: POSITIVE — AB
Cocaine: NOT DETECTED
Opiates: NOT DETECTED
Tetrahydrocannabinol: NOT DETECTED

## 2022-06-12 LAB — I-STAT BETA HCG BLOOD, ED (MC, WL, AP ONLY): I-stat hCG, quantitative: <5 m[IU]/mL

## 2022-06-12 NOTE — ED Notes (Signed)
Patient transported to CT 

## 2022-06-12 NOTE — ED Triage Notes (Signed)
BIB [facility] with c/o seizure-like activity. Seen in ED multiple times for same. EMS describes focal convulsions with incontinent episodes. 5 mg IM versed given PTA -  seizure activity improved.   Pt alert but slow to respond on arrival.

## 2022-06-12 NOTE — ED Provider Notes (Signed)
Care assumed form Dr. Fredderick Phenix, patient with possible seizures pending neurology consult.  Neurology consult is appreciated.  Recommendation was for orthostatic vital signs, serum cortisol level, serum B12 level, serum TSH level.  Labs have been ordered, orthostatic vital signs show no significant change in heart rate or blood pressure.  Recommendation also was for outpatient EEG which has been ordered.  Patient is advised to keep her follow-up appointment with her neurologist, which has already been scheduled.   Dione Booze, MD 06/13/22 [phone]

## 2022-06-12 NOTE — ED Provider Notes (Addendum)
[facility] EMERGENCY DEPARTMENT Provider Note   CSN: [id number] Arrival date & time: 06/12/22  1956     History  Chief Complaint  Patient presents with   Seizures    Teresa Ochoa is a 41 y.o. female.  Patient is a 41 year old female who presents with a possible seizure.  She said over the last week she has had several episodes where she has onset of pressure feeling in her head and then tenseness down her spine.  This is followed by palpitations which she attributes to anxiety and full body tremors.  She has had intermittent episodes of this over the last week but she had a worse episode today where she had been feeling fine all day and she had the start and per her significant other who is at bedside, she started having a full body seizure.  She was awake and talking during the episode but could not stop convulsing.  The episodes have lasted up to 45 minutes.  Today's episode resolved after EMS gave her IM Versed.  She was incontinent of urine.  There was no oral trauma.  No prior history of seizures.  She has a history of tachycardias.  It is unclear whether this is actually diagnosed as atrial fibrillation but she has had several visits with cardiology over palpitations and episodes of tachycardia.  I do not see a definitive diagnosis of atrial fibrillation.  She was previously told that she had MS but she said she got a second opinion and was told that she did not have a mass.  No prior history of seizures.  She does have a history of chronic pain and opioid abuse.  Currently is treated with Suboxone.       Home Medications Prior to Admission medications   Medication Sig Start Date End Date Taking? Authorizing Provider  albuterol (VENTOLIN HFA) 108 (90 Base) MCG/ACT inhaler Inhale 2 puffs into the lungs every 6 (six) hours as needed for wheezing or shortness of breath. 06/02/21  Yes [provider]  buprenorphine (SUBUTEX) 8 MG SUBL SL tablet Place 8 mg  under the tongue in the morning and at bedtime.   Yes [provider]  busPIRone (BUSPAR) 10 MG tablet Take 10 mg by mouth 3 (three) times daily. 06/08/22  Yes [provider]  LINZESS 145 MCG CAPS capsule Take 145 mcg by mouth daily. 01/05/22  Yes [provider]  diltiazem (CARDIZEM) 30 MG tablet Take 1 tablet (30 mg total) by mouth every 6 (six) hours as needed. Take as needed for heart palpitations Patient not taking: Reported on 06/12/2022 03/02/21   Riley Lam A, MD  rosuvastatin (CRESTOR) 10 MG tablet Take 1 tablet (10 mg total) by mouth daily. Patient not taking: Reported on 06/12/2022 03/01/21   Christell Constant, MD      Allergies    Ketorolac, Macrobid [nitrofurantoin monohyd macro], Other, Compazine [prochlorperazine], Macrobid [nitrofurantoin], Reglan [metoclopramide], Toradol [ketorolac tromethamine], Tylenol [acetaminophen], Tylenol [acetaminophen], and Compazine    Review of Systems   Review of Systems  Constitutional:  Negative for chills, diaphoresis, fatigue and fever.  HENT:  Negative for congestion, rhinorrhea and sneezing.   Eyes: Negative.   Respiratory:  Negative for cough, chest tightness and shortness of breath.   Cardiovascular:  Positive for palpitations. Negative for chest pain and leg swelling.  Gastrointestinal:  Negative for abdominal pain, blood in stool, diarrhea, nausea and vomiting.  Genitourinary:  Negative for difficulty urinating, flank pain, frequency and hematuria.  Musculoskeletal:  Negative for arthralgias and back pain.  Skin:  Negative for rash.  Neurological:  Positive for seizures. Negative for dizziness, speech difficulty, weakness, numbness and headaches.    Physical Exam Updated Vital Signs BP 117/60   Pulse 68   Temp 98.6 F (37 C) (Oral)   Resp 15   Ht 5\' 4"  (1.626 m)   Wt 83.9 kg   SpO2 96%   BMI 31.75 kg/m  Physical Exam Constitutional:      Appearance: She is well-developed.  HENT:      Head: Normocephalic and atraumatic.  Eyes:     Pupils: Pupils are equal, round, and reactive to light.  Cardiovascular:     Rate and Rhythm: Normal rate and regular rhythm.     Heart sounds: Normal heart sounds.  Pulmonary:     Effort: Pulmonary effort is normal. No respiratory distress.     Breath sounds: Normal breath sounds. No wheezing or rales.  Chest:     Chest wall: No tenderness.  Abdominal:     General: Bowel sounds are normal.     Palpations: Abdomen is soft.     Tenderness: There is no abdominal tenderness. There is no guarding or rebound.  Musculoskeletal:        General: Normal range of motion.     Cervical back: Normal range of motion and neck supple.  Lymphadenopathy:     Cervical: No cervical adenopathy.  Skin:    General: Skin is warm and dry.     Findings: No rash.  Neurological:     Mental Status: She is alert and oriented to person, place, and time.     Comments: Motor 5/5 all extremities Sensation grossly intact to LT all extremities Finger to Nose intact, no pronator drift CN II-XII grossly intact       ED Results / Procedures / Treatments   Labs (all labs ordered are listed, but only abnormal results are displayed) Labs Reviewed  COMPREHENSIVE METABOLIC PANEL - Abnormal; Notable for the following components:      Result Value   Glucose, Bld 103 (*)    Calcium 8.7 (*)    All other components within normal limits  CBC WITH DIFFERENTIAL/PLATELET - Abnormal; Notable for the following components:   WBC 11.7 (*)    Hemoglobin 11.9 (*)    Neutro Abs 8.2 (*)    All other components within normal limits  RAPID URINE DRUG SCREEN, HOSP PERFORMED - Abnormal; Notable for the following components:   Benzodiazepines POSITIVE (*)    All other components within normal limits  URINALYSIS, ROUTINE W REFLEX MICROSCOPIC - Abnormal; Notable for the following components:   Hgb urine dipstick MODERATE (*)    All other components within normal limits  I-STAT BETA  HCG BLOOD, ED ([facility], [facility], [facility] ONLY)    EKG EKG Interpretation  Date/Time:  Sunday June 12 2022 20:06:10 EDT Ventricular Rate:  70 PR Interval:    QRS Duration: 102 QT Interval:  404 QTC Calculation: 436 R Axis:   10 Text Interpretation: Normal sinus rhythm Anteroseptal infarct, old Minimal ST depression, inferior leads since last tracing no significant change Confirmed by [id number] [phone]) on 06/12/2022 8:36:48 PM  Radiology CT Head Wo Contrast  Result Date: 06/12/2022 CLINICAL DATA:  Seizures, new onset.  No history of trauma. EXAM: CT HEAD WITHOUT CONTRAST TECHNIQUE: Contiguous axial images were obtained from the base of the skull through the vertex without intravenous contrast. RADIATION DOSE REDUCTION: This exam was  performed according to the departmental dose-optimization program which includes automated exposure control, adjustment of the mA and/or kV according to patient size and/or use of iterative reconstruction technique. COMPARISON:  CT head dated February 19, 2021 FINDINGS: Brain: No evidence of acute infarction, hemorrhage, hydrocephalus, extra-axial collection or mass lesion/mass effect. Vascular: No hyperdense vessel or unexpected calcification. Skull: Normal. Negative for fracture or focal lesion. Sinuses/Orbits: No acute finding. Other: None. IMPRESSION: No acute intracranial abnormality. Electronically Signed   By: Keane Police D.O.   On: 06/12/2022 22:20    Procedures Procedures    Medications Ordered in ED Medications - No data to display  ED Course/ Medical Decision Making/ A&P                           Medical Decision Making Amount and/or Complexity of Data Reviewed Labs: ordered. Radiology: ordered.   Patient is a 41 year old female who presents with some seizure-like activity.  She has had several episodes over the last week.  No prior history of seizures.  She is awake during the episodes.  Today she did have incontinence with the episode.  The symptoms  resolved with Versed today.  She had a head CT which shows no acute abnormality.  Labs are nonconcerning.  She has no focal neurologic deficits.  Episodes seem to include palpitations although patient attributes this to the anxiety and feeling the episode starting.  She has been seen multiple times by cardiology for tachycardia.  She reports a history of A-fib although I do not see a cardiology diagnosis of A-fib on chart review.  I do not see any arrhythmias that have occurred while she is been in the ED today.  Her significant other says that he has checked her heart rate during these episodes and it did not seem fast at least during the times that he checked it.  I consulted with Dr. Lorrin Goodell who will see the pt.  Care turned over to Dr. Roxanne Mins pending  neuro recommendations.  \Final Clinical Impression(s) / ED Diagnoses Final diagnoses:  Seizure-like activity [facility])    Rx / [place] Orders ED Discharge Orders     None         Malvin Johns, MD 06/12/22 2312    Malvin Johns, MD 06/12/22 2314

## 2022-06-13 DIAGNOSIS — R569 Unspecified convulsions: Secondary | ICD-10-CM | POA: Diagnosis not present

## 2022-06-13 LAB — VITAMIN B12: Vitamin B-12: 381 pg/mL (ref 180–914)

## 2022-06-13 LAB — CORTISOL: Cortisol, Plasma: 8.8 ug/dL

## 2022-06-13 LAB — FOLATE: Folate: 8.9 ng/mL (ref >5.9)

## 2022-06-13 LAB — TSH: TSH: 4.138 u[IU]/mL (ref 0.350–4.500)

## 2022-06-13 NOTE — Consult Note (Signed)
NEUROLOGY CONSULTATION NOTE   Date of service: June 13, 2022 Patient Name: Teresa Ochoa MRN:  [id number] DOB:  Apr 12, 1981 Reason for consult: "Shaking concerning for a seizure" Requesting Provider: Delora Fuel, MD _ _ _   _ __   _ __ _ _  __ __   _ __   __ _  History of Present Illness  Teresa Ochoa is a 41 y.o. female with PMH significant for opiod dependence, migraines, hx of substance use, ?afibb vs sinus tach who presents with a ?seizure.  Multiple presentations to the ED for chest pain in the last fwe months.  Reports that she had something different starting Saturday 06/04/22. She describes a prodrome of dizziness which she describes as lightheadedness, feeling pressure in her head and down her neck and into her shoulder blades. She then reports that this progresses to shaking of BL arms and legs. Legs shake more than arms. She is able to answer questions during this. Her head does not shake. She had a brief video of this today which I reviewed and althou the video was very short, the frequency of the episode did not seem to be concerning for a seizure and she seemed to have good control of her head and eyes. The episode today lasted about 20+ mins per husband at the bedside. She reports that during these episodes, her blood pressure is in [id number] systolic and her heart rate is elevated.  Today's episode was the longest, she urinated on her herself. No tongue biting. EMS gave her 4mg  of Versed which resolved the movement.  She denies any stressors in life, no anxiety, she denies any fever, no signs or symptoms of a UTI, URI or gastroenteritis. Reports that she is typically constipated.  No prior hx of seizures, no hx of EtOH use, no hx of significant head injury with LOC, no hx of Stroke, ICH, no CNS surgeries, no family hx of seizures, no hx of childhood seizures or starring spells.    ROS   Constitutional Denies weight loss, fever and chills.   HEENT Denies changes in vision and  hearing.   Respiratory Denies SOB and cough.   CV Denies palpitations and CP   GI Denies abdominal pain, nausea, vomiting and diarrhea.   GU Denies dysuria and urinary frequency.   MSK Denies myalgia and joint pain.   Skin Denies rash and pruritus.   Neurological Denies headache and syncope.   Psychiatric Denies recent changes in mood. Denies anxiety and depression.    Past History   Past Medical History:  Diagnosis Date   A-fib [facility])    Chest pain of unknown etiology    Chronic back pain    Chronic leg pain    Chronic pain syndrome    Depression    Reports more depression since the accident    Enlarged liver    Gastroparesis    Heart abnormality 08/03/2016   place on Heart monitor for Afib   Migraine    Migraine    Migraines 01/21/2016   MS (multiple sclerosis) ([place])    Opioid dependence on agonist therapy ([place])    Palpitations    Substance abuse ([place])    Past Surgical History:  Procedure Laterality Date   ABDOMINAL HYSTERECTOMY     APPENDECTOMY     CHOLECYSTECTOMY     GALLBLADDER SURGERY     JOINT REPLACEMENT     SALIVARY GLAND SURGERY     TUBAL LIGATION     Family  History  Problem Relation Age of Onset   Heart disease Maternal Grandmother    Diabetes Maternal Grandmother    Hypertension Maternal Grandmother    Social History   Socioeconomic History   Marital status: Married    Spouse name: Not on file   Number of children: Not on file   Years of education: Not on file   Highest education level: Not on file  Occupational History   Not on file  Tobacco Use   Smoking status: Former    Packs/day: 1.00    Types: Cigarettes    Quit date: 03/27/2022    Years since quitting: 0.2   Smokeless tobacco: Never  Vaping Use   Vaping Use: Some days   Substances: Nicotine  Substance and Sexual Activity   Alcohol use: Not Currently    Comment: occ   Drug use: No   Sexual activity: Not on file  Other Topics Concern   Not on file  Social History Narrative    ** Merged History Encounter **       Social Determinants of Health   Financial Resource Strain: Not on file  Food Insecurity: Not on file  Transportation Needs: Not on file  Physical Activity: Not on file  Stress: Not on file  Social Connections: Not on file   Allergies  Allergen Reactions   Ketorolac Anaphylaxis   Macrobid [Nitrofurantoin Monohyd Macro] Hives and Swelling   Other Hives, Rash and Swelling   Compazine [Prochlorperazine]    Macrobid [Nitrofurantoin]    Reglan [Metoclopramide]      Makes my heart beat fast   Toradol [Ketorolac Tromethamine]    Tylenol [Acetaminophen]     Enlarged Liver   Tylenol [Acetaminophen] Other (See Comments)    Due to fatty liver   Compazine Anxiety    Medications  (Not in a hospital admission)    Vitals   Vitals:   06/12/22 2115 06/12/22 2130 06/12/22 2145 06/12/22 2248  BP: 98/62 124/66 118/75 117/60  Pulse: 63 62 (!) 59 68  Resp: 17 12 20 15   Temp:      TempSrc:      SpO2: 95% 96% 96% 96%  Weight:      Height:         Body mass index is 31.75 kg/m.  Physical Exam   General: Laying comfortably in bed; in no acute distress.  HENT: Normal oropharynx and mucosa. Normal external appearance of ears and nose.  Neck: Supple, no pain or tenderness  CV: No JVD. No peripheral edema.  Pulmonary: Symmetric Chest rise. Normal respiratory effort.  Abdomen: Soft to touch, non-tender.  Ext: No cyanosis, edema, or deformity  Skin: No rash. Normal palpation of skin.   Musculoskeletal: Normal digits and nails by inspection. No clubbing.   Neurologic Examination  Mental status/Cognition: Alert, oriented to self, place, month and year, good attention.  Speech/language: Fluent, comprehension intact, object naming intact, repetition intact.  Cranial nerves:   CN II Pupils equal and reactive to light, no VF deficits    CN III,IV,VI EOM intact, no gaze preference or deviation, no nystagmus    CN V normal sensation in V1, V2, and V3  segments bilaterally    CN VII no asymmetry, no nasolabial fold flattening    CN VIII normal hearing to speech    CN IX & X normal palatal elevation, no uvular deviation    CN XI 5/5 head turn and 5/5 shoulder shrug bilaterally    CN XII midline tongue  protrusion    Motor:  Muscle bulk: normal, tone normal, pronator drift none tremor none Mvmt Root Nerve  Muscle Right Left Comments  SA C5/6 Ax Deltoid 5 5   EF C5/6 Mc Biceps 5 5   EE C6/7/8 Rad Triceps 5 5   WF C6/7 Med FCR     WE C7/8 PIN ECU     F Ab C8/T1 U ADM/FDI 5 5   HF L1/2/3 Fem Illopsoas 5 5   KE L2/3/4 Fem Quad 5 5   DF L4/5 D Peron Tib Ant 5 5   PF S1/2 Tibial Grc/Sol 5 5    Reflexes:  Right Left Comments  Pectoralis      Biceps (C5/6) 2 2   Brachioradialis (C5/6) 2 2    Triceps (C6/7) 2 2    Patellar (L3/4) 2 2    Achilles (S1)      Hoffman      Plantar     Jaw jerk    Sensation:  Light touch Intact throughout   Pin prick    Temperature    Vibration   Proprioception    Coordination/Complex Motor:  - Finger to Nose intact BL - Heel to shin are normal - Rapid alternating movement are normal - Gait: deferred.  Labs   CBC:  Recent Labs  Lab 06/06/22 1047 06/12/22 2050  WBC 10.3 11.7*  NEUTROABS 8.2* 8.2*  HGB 13.3 11.9*  HCT 38.9 36.4  MCV 87.0 90.3  PLT 316 310    Basic Metabolic Panel:  Lab Results  Component Value Date   NA 140 06/12/2022   K 3.6 06/12/2022   CO2 26 06/12/2022   GLUCOSE 103 (H) 06/12/2022   BUN 8 06/12/2022   CREATININE 0.78 06/12/2022   CALCIUM 8.7 (L) 06/12/2022   GFRNONAA >60 06/12/2022   GFRAA >60 05/28/2018   Lipid Panel:  Lab Results  Component Value Date   LDLCALC 154 (H) 02/24/2021   HgbA1c: No results found for: "HGBA1C" Urine Drug Screen:     Component Value Date/Time   LABOPIA NONE DETECTED 06/12/2022 2217   COCAINSCRNUR NONE DETECTED 06/12/2022 2217   LABBENZ POSITIVE (A) 06/12/2022 2217   AMPHETMU NONE DETECTED 06/12/2022 2217   THCU NONE  DETECTED 06/12/2022 2217   LABBARB NONE DETECTED 06/12/2022 2217    Alcohol Level No results found for: "ETH"  CT Head without contrast(Personally reviewed): CTH was negative for a large hypodensity concerning for a large territory infarct or hyperdensity concerning for an ICH  rEEG:  To be obtained outpatient  Impression   Teresa Ochoa is a 41 y.o. female with PMH significant for opiod dependence, migraines, hx of substance use in the past but not current, ?afibb vs sinus tach who presents with an episode of all extremity shaking with intact mentation. Reports prodrome of lightheaded with head pressure, followed by shaking for 20 mins. At the end, she urinated on herself. EMS also gave her some Versed.  She is back to her baseline and has no focal deficit.  Clinical suspicion for a seizure is low. This is based on the fact that the movements in the video did not seem to be consistent with a seizure, I would not expect a seizure with all extremity shaking and therefore involving BL hemispheres to have intact mentation. No seizure risk factors on history.  Still unclear what to make of this episode but will get routine EEG, orthostatic vitals with BP/HR is lying flat, sitting and standing. I did  discuss with patient about this being potentially stress related but I do not think she is very receptive to that at this time. I think  if the EEG and workup is negative, they might be more receptive to that diagnosis.  Recommendations  - rEEG outpatient - Follow up with Neurology outpatient. She is scheduled to see a neurologist in [place] on the 8th of august. - orthostatic vitals x 1. - TSH, cortisol, Vit b12, folate. - okay to discharge from neuro standpoint. ______________________________________________________________________   Thank you for the opportunity to take part in the care of this patient. If you have any further questions, please contact the neurology consultation  attending.  Signed,  [place] Pager Number [phone] _ _ _   _ __   _ __ _ _  __ __   _ __   __ _

## 2022-06-13 NOTE — Discharge Instructions (Signed)
You had some blood work done today, results should be available within 1-2 days.  You can check those results on MyChart.  Please go for the EEG which has been ordered.  Please see the neurologist as scheduled.  Your neurologist should be able to see the results for the EEG and blood work when you see them.

## 2022-06-22 ENCOUNTER — Other Ambulatory Visit: Payer: Self-pay

## 2022-06-22 ENCOUNTER — Emergency Department (HOSPITAL_COMMUNITY)
Admission: EM | Admit: 2022-06-22 | Discharge: 2022-06-22 | Disposition: A | Discharge status: Home / Self Care | Payer: No Typology Code available for payment source | Attending: Emergency Medicine | Admitting: Emergency Medicine

## 2022-06-22 ENCOUNTER — Emergency Department (HOSPITAL_COMMUNITY): Payer: No Typology Code available for payment source

## 2022-06-22 DIAGNOSIS — D72829 Elevated white blood cell count, unspecified: Secondary | ICD-10-CM | POA: Diagnosis not present

## 2022-06-22 DIAGNOSIS — R079 Chest pain, unspecified: Secondary | ICD-10-CM | POA: Diagnosis not present

## 2022-06-22 DIAGNOSIS — F419 Anxiety disorder, unspecified: Secondary | ICD-10-CM | POA: Insufficient documentation

## 2022-06-22 DIAGNOSIS — R0789 Other chest pain: Secondary | ICD-10-CM | POA: Diagnosis not present

## 2022-06-22 DIAGNOSIS — R569 Unspecified convulsions: Secondary | ICD-10-CM | POA: Diagnosis not present

## 2022-06-22 LAB — COMPREHENSIVE METABOLIC PANEL
ALT: 31 U/L (ref 0–44)
AST: 26 U/L (ref 15–41)
Albumin: 4.5 g/dL (ref 3.5–5.0)
Alkaline Phosphatase: 67 U/L (ref 38–126)
Anion gap: 6 (ref 5–15)
BUN: 6 mg/dL (ref 6–20)
CO2: 24 mmol/L (ref 22–32)
Calcium: 9.5 mg/dL (ref 8.9–10.3)
Chloride: 108 mmol/L (ref 98–111)
Creatinine, Ser: 0.75 mg/dL (ref 0.44–1.00)
GFR, Estimated: >60 mL/min (ref >60)
Glucose, Bld: 113 mg/dL — ABNORMAL HIGH (ref 70–99)
Potassium: 4.4 mmol/L (ref 3.5–5.1)
Sodium: 138 mmol/L (ref 135–145)
Total Bilirubin: 0.6 mg/dL (ref 0.3–1.2)
Total Protein: 7.6 g/dL (ref 6.5–8.1)

## 2022-06-22 LAB — TROPONIN I (HIGH SENSITIVITY): Troponin I (High Sensitivity): 4 ng/L (ref <18)

## 2022-06-22 LAB — CBC WITH DIFFERENTIAL/PLATELET
Abs Immature Granulocytes: 0.04 10*3/uL (ref 0.00–0.07)
Basophils Absolute: 0.1 10*3/uL (ref 0.0–0.1)
Basophils Relative: 1 %
Eosinophils Absolute: 0.2 10*3/uL (ref 0.0–0.5)
Eosinophils Relative: 1 %
HCT: 44.5 % (ref 36.0–46.0)
Hemoglobin: 14.5 g/dL (ref 12.0–15.0)
Immature Granulocytes: 0 %
Lymphocytes Relative: 26 %
Lymphs Abs: 3.2 10*3/uL (ref 0.7–4.0)
MCH: 28.9 pg (ref 26.0–34.0)
MCHC: 32.6 g/dL (ref 30.0–36.0)
MCV: 88.8 fL (ref 80.0–100.0)
Monocytes Absolute: 0.8 10*3/uL (ref 0.1–1.0)
Monocytes Relative: 7 %
Neutro Abs: 8.1 10*3/uL — ABNORMAL HIGH (ref 1.7–7.7)
Neutrophils Relative %: 65 %
Platelets: 322 10*3/uL (ref 150–400)
RBC: 5.01 MIL/uL (ref 3.87–5.11)
RDW: 13.2 % (ref 11.5–15.5)
WBC: 12.4 10*3/uL — ABNORMAL HIGH (ref 4.0–10.5)
nRBC: 0 % (ref 0.0–0.2)

## 2022-06-22 LAB — MAGNESIUM: Magnesium: 1.9 mg/dL (ref 1.7–2.4)

## 2022-06-22 LAB — CBG MONITORING, ED: Glucose-Capillary: 105 mg/dL — ABNORMAL HIGH (ref 70–99)

## 2022-06-22 LAB — I-STAT BETA HCG BLOOD, ED (MC, WL, AP ONLY): I-stat hCG, quantitative: <5 m[IU]/mL (ref <5)

## 2022-06-22 LAB — ETHANOL: Alcohol, Ethyl (B): <10 mg/dL (ref <10)

## 2022-06-22 MED ORDER — TOPIRAMATE 25 MG PO TABS: 25.0000 mg | ORAL_TABLET | Freq: Two times a day (BID) | ORAL | 0 refills | Status: AC

## 2022-06-22 NOTE — ED Triage Notes (Signed)
Pt BIB EMS due to a seizure[place]. Pt states she has been having a seizure everyday since July 15th. Pt is on medication but states it isn't working.EMS states pt was post- ictal. VSS. Axox4.

## 2022-06-22 NOTE — ED Notes (Signed)
Pt stated that she does not want me to stick her for labs and want blood to be pull off IV

## 2022-06-22 NOTE — ED Notes (Signed)
Pt ambulatory to bathroom

## 2022-06-22 NOTE — ED Provider Notes (Signed)
Care transferred from [facility], PA-C at time of sign out. See their note for full assessment.   Briefly: Patient is 41 y.o. female who presents to the ED with concerns for seizure-like activity prior to arrival.     Plan: Plan per previous PA-C: Pt with a normal neuro exam per previous PA-C. Pending neuro consult and recommendations and labs.   Clinical Course as of 06/22/22 2330  Wed Jun 22, 2022  1506 CBC with Differential/Platelet(!) [BM]  1506 CBC with Differential/Platelet(!) CBC with mildly elevated WBCs at 12.4 similar to 10 days ago.  No anemia or thrombocytopenia. [BM]  1506 I-Stat beta hCG blood, ED Pregnancy test negative [BM]  1506 DG Chest Portable 1 View I have personally reviewed interpreted patient's chest x-ray do not appreciate any obvious PTX, PNA or other acute cardiopulmonary pathologies. [BM]  1514 Patient evaluated and resting on stretcher. Discussed with patient and husband that we are awaiting neuro consult for their recommendations. Pt notes that she has been taking her Topamax as prescribed to her. Also notes that she has a follow up appointment with [facility] on 06/27/22.  [SB]  1608 Consult with Neurologist, Dr. Selina Cooley who recommends increasing topomax to 25 mg BID and outpatient follow up. [SB]  1643 Discussed with patient and patients spouse at bedside regarding recommendations from Neurologist, patient and husband upset at this time. Pt noted that she would like to leave the ED at this time and began to take off her blood pressure cuff and attempted to remove her IV. [SB]  1731 Notified by RN that patient left the ED prior to discharge paperwork being printed.  Prescription for topomax 25 mg BID sent to patient pharmacy. Further instructions typed in patients discharge paperwork. [SB]  1759 Review of chart notes that patient has a follow-up appointment with her primary care provider tomorrow at 9:15 AM. [SB]    Clinical Course User Index [BM] Bill Salinas, PA-C [SB] Ina Scrivens A, PA-C    Pt was evaluated in the ED on 06/12/22 for similar symptoms. At that time had a consult placed with neurologist, Dr. Derry Lory who recommended labs as well as outpatient EEG and neuro follow up. No seizure like activity in the ED during this current visit.  Discussion with neurologist, Dr. Selina Cooley who notes that patient can follow-up in the outpatient setting with the already ordered EEG and scheduled for neurology follow-up on 06/27/2022 with [facility] neurology.  Doubt hypoglycemia, electrolyte abnormality, EtOH, infectious process as cause of patient's symptoms. Supportive care and strict return precautions discussed with patient. Patient acknowledges and verbalizes understanding. Pt appears safe for discharge. Follow up as indicated in discharge paperwork.    This chart was dictated using voice recognition software, Dragon. Despite the best efforts of this provider to proofread and correct errors, errors may still occur which can change documentation meaning.   Kresta Templeman A, PA-C 06/22/22 2330    Terrilee Files, MD 06/23/22 [phone]

## 2022-06-22 NOTE — Discharge Instructions (Addendum)
Your topamax was increased to twice a day, a prescription has been sent to your pharmacy. Maintain your scheduled follow up appointment with neurology on 06/27/22 regarding todays ED visit. Call your primary care provider to set up a follow up appointment regarding todays ED visit. Return to the ED if you are experiencing increasing/worsening symptoms.

## 2022-06-22 NOTE — ED Provider Notes (Addendum)
[facility] EMERGENCY DEPARTMENT Provider Note   CSN: [id number] Arrival date & time: 06/22/22  1324     History  Chief Complaint  Patient presents with   Seizures    Teresa Ochoa is a 41 y.o. female history includes migraines, atrial fibrillation, chronic pain, gastroparesis, migraines, MS.  Patient present following seizure-like activity at home.  Patient reports she was sitting in her recliner at home when she became lightheaded dizzy and developed a central chest pressure, patient reports that she then began to shake and then lost consciousness.  Patient believes this lasted for several minutes and was witnessed by her children.  Patient reports that she has had similar events daily for the past 2 weeks, she reports she has been seen in the ER several times for the same problem but does not have diagnoses and problem has persisted.  She reports that she has a mild headache since her episode today it is throbbing constant no clear aggravating alleviating factors does not radiate.  Patient reports that her central chest pressure has somewhat improved but is still mild at this time.  She denies fall to the ground, injury, fever, chills, vision changes, numbness, tingling, weakness, abdominal pain, shortness of breath, vomiting, diarrhea or any additional concerns.  Additional history obtained from patient's husband at bedside who has a video of the patient having 1 of these episodes in the passenger seat of their truck from last Friday.  Patient appears to be convulsing with her right arm and leg while holding her chest with her left hand. HPI     Home Medications Prior to Admission medications   Medication Sig Start Date End Date Taking? Authorizing Provider  albuterol (VENTOLIN HFA) 108 (90 Base) MCG/ACT inhaler Inhale 2 puffs into the lungs every 6 (six) hours as needed for wheezing or shortness of breath. 06/02/21  Yes [provider]  buprenorphine  (SUBUTEX) 8 MG SUBL SL tablet Place 8 mg under the tongue in the morning and at bedtime.   Yes [provider]  busPIRone (BUSPAR) 10 MG tablet Take 10 mg by mouth 3 (three) times daily. 06/08/22  Yes [provider]  LINZESS 145 MCG CAPS capsule Take 145 mcg by mouth daily. 01/05/22  Yes [provider]  topiramate (TOPAMAX) 25 MG tablet Take 25 mg by mouth at bedtime. 06/17/22  Yes [provider]  diltiazem (CARDIZEM) 30 MG tablet Take 1 tablet (30 mg total) by mouth every 6 (six) hours as needed. Take as needed for heart palpitations Patient not taking: Reported on 06/12/2022 03/02/21   Riley Lam A, MD  rosuvastatin (CRESTOR) 10 MG tablet Take 1 tablet (10 mg total) by mouth daily. Patient not taking: Reported on 06/12/2022 03/01/21   Christell Constant, MD      Allergies    Ketorolac, Macrobid [nitrofurantoin monohyd macro], Other, Compazine [prochlorperazine], Macrobid [nitrofurantoin], Reglan [metoclopramide], Toradol [ketorolac tromethamine], Tylenol [acetaminophen], Tylenol [acetaminophen], and Compazine    Review of Systems   Review of Systems Ten systems are reviewed and are negative for acute change except as noted in the HPI  Physical Exam Updated Vital Signs BP 118/65   Pulse 75   Temp 97.9 F (36.6 C) (Oral)   Resp 13   Ht 5\' 3"  (1.6 m)   Wt 83.9 kg   SpO2 99%   BMI 32.77 kg/m  Physical Exam Constitutional:      General: She is not in acute distress.    Appearance: Normal  appearance. She is well-developed. She is not ill-appearing or diaphoretic.  HENT:     Head: Normocephalic and atraumatic.  Eyes:     General: Vision grossly intact. Gaze aligned appropriately.     Pupils: Pupils are equal, round, and reactive to light.  Neck:     Trachea: Trachea and phonation normal.  Cardiovascular:     Rate and Rhythm: Normal rate and regular rhythm.     Pulses:          Dorsalis pedis pulses are 2+ on the right side and 2+ on  the left side.  Pulmonary:     Effort: Pulmonary effort is normal. No respiratory distress.  Abdominal:     General: There is no distension.     Palpations: Abdomen is soft.     Tenderness: There is no abdominal tenderness. There is no guarding or rebound.  Musculoskeletal:        General: Normal range of motion.     Cervical back: Normal range of motion.     Right lower leg: No edema.     Left lower leg: No edema.  Feet:     Right foot:     Protective Sensation: 3 sites tested.  3 sites sensed.     Left foot:     Protective Sensation: 3 sites tested.  3 sites sensed.  Skin:    General: Skin is warm and dry.  Neurological:     Mental Status: She is alert.     GCS: GCS eye subscore is 4. GCS verbal subscore is 5. GCS motor subscore is 6.     Comments: Speech is clear and goal oriented, follows commands Major Cranial nerves without deficit, no facial droop Normal strength in upper and lower extremities bilaterally including dorsiflexion and plantar flexion, strong and equal grip strength Sensation normal to light and sharp touch Moves extremities without ataxia, coordination intact  Psychiatric:        Attention and Perception: Attention normal.        Mood and Affect: Mood is anxious.        Behavior: Behavior normal. Behavior is cooperative.    ED Results / Procedures / Treatments   Labs (all labs ordered are listed, but only abnormal results are displayed) Labs Reviewed  COMPREHENSIVE METABOLIC PANEL - Abnormal; Notable for the following components:      Result Value   Glucose, Bld 113 (*)    All other components within normal limits  CBC WITH DIFFERENTIAL/PLATELET - Abnormal; Notable for the following components:   WBC 12.4 (*)    Neutro Abs 8.1 (*)    All other components within normal limits  CBG MONITORING, ED - Abnormal; Notable for the following components:   Glucose-Capillary 105 (*)    All other components within normal limits  MAGNESIUM  ETHANOL  RAPID  URINE DRUG SCREEN, HOSP PERFORMED  I-STAT BETA HCG BLOOD, ED ([facility], [facility], [facility] ONLY)  TROPONIN I (HIGH SENSITIVITY)  TROPONIN I (HIGH SENSITIVITY)    EKG None  Radiology DG Chest Portable 1 View  Result Date: 06/22/2022 CLINICAL DATA:  Chest pain.  Seizure. EXAM: PORTABLE CHEST 1 VIEW COMPARISON:  06/06/2022 FINDINGS: The cardiac silhouette, mediastinal and hilar contours are normal. The lungs are clear of an acute process. No pulmonary lesions or pleural effusions. The bony thorax is intact. IMPRESSION: No acute cardiopulmonary findings. Electronically Signed   By: Rudie Meyer M.D.   On: 06/22/2022 14:40    Procedures Procedures  Medications Ordered in ED Medications - No data to display  ED Course/ Medical Decision Making/ A&P Clinical Course as of 06/22/22 1611  Wed Jun 22, 2022  1506 CBC with Differential/Platelet(!) [BM]  1506 CBC with Differential/Platelet(!) CBC with mildly elevated WBCs at 12.4 similar to 10 days ago.  No anemia or thrombocytopenia. [BM]  1506 I-Stat beta hCG blood, ED Pregnancy test negative [BM]  1506 DG Chest Portable 1 View I have personally reviewed interpreted patient's chest x-ray do not appreciate any obvious PTX, PNA or other acute cardiopulmonary pathologies. [BM]  1608 Consult with [SB]    Clinical Course User Index [BM] Deliah Boston, PA-C [SB] Blue, Jene Every, PA-C                           Medical Decision Making INNOCENCE SOSNA is a 41 y.o. female history includes migraines, atrial fibrillation, chronic pain, gastroparesis, migraines, MS.  Patient present following seizure-like activity at home.  Patient reports she was sitting in her recliner at home when she became lightheaded dizzy and developed a central chest pressure, patient reports that she then began to shake and then lost consciousness.  Patient believes this lasted for several minutes and was witnessed by her children.  Patient reports that she has had similar events daily  for the past 2 weeks, she reports she has been seen in the ER several times for the same problem but does not have diagnoses and problem has persisted.  She reports that she has a mild headache since her episode today it is throbbing constant no clear aggravating alleviating factors does not radiate.  Patient reports that her central chest pressure has somewhat improved but is still mild at this time.  She denies fall to the ground, injury, fever, chills, vision changes, numbness, tingling, weakness, abdominal pain, shortness of breath, vomiting, diarrhea or any additional concerns.  Additional history obtained from patient's husband at bedside who has a video of the patient having 1 of these episodes in the passenger seat of their truck from last Friday.  Patient appears to be convulsing with her right arm and leg while holding her chest with her left hand.  Amount and/or Complexity of Data Reviewed Independent Historian: spouse External Data Reviewed: notes. Labs: ordered. Decision-making details documented in ED Course. Radiology: ordered. Decision-making details documented in ED Course. ECG/medicine tests: ordered. Decision-making details documented in ED Course.   Care handoff given to [facility] PA-C at shift change.  Plan of care at shift change is to await pending labs.  Consult was also been placed to neurology at this point to see if they would like to evaluate patient and ordered EEG while she is here and for further recommendations.  I have not yet received callback from neurology.  Plan of care is to await labs, neurology consult, disposition per neurology recommendations and results.  Final disposition per oncoming team.  Plan of care was discussed with Dr. Doren Custard who agrees with neurology consultation.  Note: Portions of this report may have been transcribed using voice recognition software. Every effort was made to ensure accuracy; however, inadvertent computerized transcription errors may  still be present.          Final Clinical Impression(s) / ED Diagnoses Final diagnoses:  None    Rx / DC Orders ED Discharge Orders     None         Deliah Boston, PA-C 06/22/22 1509  Gari Crown 06/22/22 1612    Godfrey Pick, MD 06/26/22 [phone]

## 2022-06-23 DIAGNOSIS — R7989 Other specified abnormal findings of blood chemistry: Secondary | ICD-10-CM | POA: Diagnosis not present

## 2022-06-23 DIAGNOSIS — R569 Unspecified convulsions: Secondary | ICD-10-CM | POA: Diagnosis not present

## 2022-06-25 DIAGNOSIS — K449 Diaphragmatic hernia without obstruction or gangrene: Secondary | ICD-10-CM | POA: Diagnosis not present

## 2022-06-25 DIAGNOSIS — G40909 Epilepsy, unspecified, not intractable, without status epilepticus: Secondary | ICD-10-CM | POA: Diagnosis not present

## 2022-06-25 DIAGNOSIS — R260 Ataxic gait: Secondary | ICD-10-CM | POA: Diagnosis not present

## 2022-06-25 DIAGNOSIS — R42 Dizziness and giddiness: Secondary | ICD-10-CM | POA: Diagnosis not present

## 2022-06-25 DIAGNOSIS — H6191 Disorder of right external ear, unspecified: Secondary | ICD-10-CM | POA: Diagnosis not present

## 2022-06-25 DIAGNOSIS — R32 Unspecified urinary incontinence: Secondary | ICD-10-CM | POA: Diagnosis not present

## 2022-06-25 DIAGNOSIS — R1013 Epigastric pain: Secondary | ICD-10-CM | POA: Diagnosis not present

## 2022-06-25 DIAGNOSIS — R569 Unspecified convulsions: Secondary | ICD-10-CM | POA: Diagnosis not present

## 2022-06-25 DIAGNOSIS — D72829 Elevated white blood cell count, unspecified: Secondary | ICD-10-CM | POA: Diagnosis not present

## 2022-06-25 DIAGNOSIS — I4891 Unspecified atrial fibrillation: Secondary | ICD-10-CM | POA: Diagnosis not present

## 2022-06-25 DIAGNOSIS — Z886 Allergy status to analgesic agent status: Secondary | ICD-10-CM | POA: Diagnosis not present

## 2022-06-25 DIAGNOSIS — Z888 Allergy status to other drugs, medicaments and biological substances status: Secondary | ICD-10-CM | POA: Diagnosis not present

## 2022-06-26 DIAGNOSIS — K449 Diaphragmatic hernia without obstruction or gangrene: Secondary | ICD-10-CM | POA: Diagnosis not present

## 2022-06-26 DIAGNOSIS — R569 Unspecified convulsions: Secondary | ICD-10-CM | POA: Diagnosis not present

## 2022-06-26 DIAGNOSIS — R1013 Epigastric pain: Secondary | ICD-10-CM | POA: Diagnosis not present

## 2022-06-26 DIAGNOSIS — R42 Dizziness and giddiness: Secondary | ICD-10-CM | POA: Diagnosis not present

## 2022-07-02 DIAGNOSIS — R251 Tremor, unspecified: Secondary | ICD-10-CM | POA: Diagnosis not present

## 2022-07-02 DIAGNOSIS — R569 Unspecified convulsions: Secondary | ICD-10-CM | POA: Diagnosis not present

## 2022-07-02 DIAGNOSIS — R42 Dizziness and giddiness: Secondary | ICD-10-CM | POA: Diagnosis not present

## 2022-07-02 DIAGNOSIS — H9319 Tinnitus, unspecified ear: Secondary | ICD-10-CM | POA: Diagnosis not present

## 2022-07-02 DIAGNOSIS — R519 Headache, unspecified: Secondary | ICD-10-CM | POA: Diagnosis not present

## 2022-07-04 DIAGNOSIS — R251 Tremor, unspecified: Secondary | ICD-10-CM | POA: Diagnosis not present

## 2022-07-04 DIAGNOSIS — R519 Headache, unspecified: Secondary | ICD-10-CM | POA: Diagnosis not present

## 2022-09-09 DIAGNOSIS — D72820 Lymphocytosis (symptomatic): Secondary | ICD-10-CM | POA: Diagnosis not present

## 2022-09-09 DIAGNOSIS — D72821 Monocytosis (symptomatic): Secondary | ICD-10-CM | POA: Diagnosis not present

## 2023-02-22 DIAGNOSIS — J019 Acute sinusitis, unspecified: Secondary | ICD-10-CM | POA: Diagnosis not present

## 2023-02-22 DIAGNOSIS — B9689 Other specified bacterial agents as the cause of diseases classified elsewhere: Secondary | ICD-10-CM | POA: Diagnosis not present

## 2023-03-10 DIAGNOSIS — G894 Chronic pain syndrome: Secondary | ICD-10-CM | POA: Diagnosis not present

## 2023-03-10 DIAGNOSIS — R7989 Other specified abnormal findings of blood chemistry: Secondary | ICD-10-CM | POA: Diagnosis not present

## 2023-03-10 DIAGNOSIS — E669 Obesity, unspecified: Secondary | ICD-10-CM | POA: Diagnosis not present

## 2023-03-10 DIAGNOSIS — D72820 Lymphocytosis (symptomatic): Secondary | ICD-10-CM | POA: Diagnosis not present

## 2023-03-10 DIAGNOSIS — D72829 Elevated white blood cell count, unspecified: Secondary | ICD-10-CM | POA: Diagnosis not present

## 2023-03-10 DIAGNOSIS — D72821 Monocytosis (symptomatic): Secondary | ICD-10-CM | POA: Diagnosis not present

## 2023-03-10 DIAGNOSIS — F17210 Nicotine dependence, cigarettes, uncomplicated: Secondary | ICD-10-CM | POA: Diagnosis not present

## 2023-03-10 DIAGNOSIS — R002 Palpitations: Secondary | ICD-10-CM | POA: Diagnosis not present

## 2023-03-13 DIAGNOSIS — G629 Polyneuropathy, unspecified: Secondary | ICD-10-CM | POA: Diagnosis not present

## 2023-03-13 DIAGNOSIS — R202 Paresthesia of skin: Secondary | ICD-10-CM | POA: Diagnosis not present

## 2023-03-21 DIAGNOSIS — B9689 Other specified bacterial agents as the cause of diseases classified elsewhere: Secondary | ICD-10-CM | POA: Diagnosis not present

## 2023-03-21 DIAGNOSIS — J019 Acute sinusitis, unspecified: Secondary | ICD-10-CM | POA: Diagnosis not present

## 2023-04-11 DIAGNOSIS — E669 Obesity, unspecified: Secondary | ICD-10-CM | POA: Diagnosis not present

## 2023-04-11 DIAGNOSIS — R7303 Prediabetes: Secondary | ICD-10-CM | POA: Diagnosis not present

## 2023-04-11 DIAGNOSIS — H818X9 Other disorders of vestibular function, unspecified ear: Secondary | ICD-10-CM | POA: Diagnosis not present

## 2023-04-11 DIAGNOSIS — F1120 Opioid dependence, uncomplicated: Secondary | ICD-10-CM | POA: Diagnosis not present

## 2023-04-20 DIAGNOSIS — B9689 Other specified bacterial agents as the cause of diseases classified elsewhere: Secondary | ICD-10-CM | POA: Diagnosis not present

## 2023-04-20 DIAGNOSIS — J019 Acute sinusitis, unspecified: Secondary | ICD-10-CM | POA: Diagnosis not present

## 2023-05-03 DIAGNOSIS — Z1231 Encounter for screening mammogram for malignant neoplasm of breast: Secondary | ICD-10-CM | POA: Diagnosis not present

## 2023-05-03 DIAGNOSIS — Z1330 Encounter for screening examination for mental health and behavioral disorders, unspecified: Secondary | ICD-10-CM | POA: Diagnosis not present

## 2023-05-03 DIAGNOSIS — F1120 Opioid dependence, uncomplicated: Secondary | ICD-10-CM | POA: Diagnosis not present

## 2023-05-03 DIAGNOSIS — F1190 Opioid use, unspecified, uncomplicated: Secondary | ICD-10-CM | POA: Diagnosis not present

## 2023-05-03 DIAGNOSIS — Z1331 Encounter for screening for depression: Secondary | ICD-10-CM | POA: Diagnosis not present

## 2023-05-10 DIAGNOSIS — J019 Acute sinusitis, unspecified: Secondary | ICD-10-CM | POA: Diagnosis not present

## 2023-05-10 DIAGNOSIS — B9689 Other specified bacterial agents as the cause of diseases classified elsewhere: Secondary | ICD-10-CM | POA: Diagnosis not present

## 2023-07-20 ENCOUNTER — Ambulatory Visit: Payer: Self-pay | Admitting: Physician Assistant

## 2023-07-20 ENCOUNTER — Emergency Department (HOSPITAL_BASED_OUTPATIENT_CLINIC_OR_DEPARTMENT_OTHER)
Admission: EM | Admit: 2023-07-20 | Discharge: 2023-07-20 | Disposition: A | Discharge status: Home / Self Care | Payer: Self-pay | Attending: Emergency Medicine | Admitting: Emergency Medicine

## 2023-07-20 ENCOUNTER — Encounter (HOSPITAL_BASED_OUTPATIENT_CLINIC_OR_DEPARTMENT_OTHER): Payer: Self-pay | Admitting: *Deleted

## 2023-07-20 ENCOUNTER — Other Ambulatory Visit: Payer: Self-pay

## 2023-07-20 ENCOUNTER — Emergency Department (HOSPITAL_BASED_OUTPATIENT_CLINIC_OR_DEPARTMENT_OTHER): Payer: Self-pay

## 2023-07-20 ENCOUNTER — Encounter: Payer: Self-pay | Admitting: Physician Assistant

## 2023-07-20 DIAGNOSIS — R000 Tachycardia, unspecified: Secondary | ICD-10-CM | POA: Insufficient documentation

## 2023-07-20 DIAGNOSIS — M25511 Pain in right shoulder: Secondary | ICD-10-CM

## 2023-07-20 MED ORDER — METHOCARBAMOL 500 MG PO TABS: 500.0000 mg | ORAL_TABLET | Freq: Four times a day (QID) | ORAL | 0 refills | Status: AC | PRN

## 2023-07-20 MED ORDER — METHYLPREDNISOLONE 4 MG PO TBPK: ORAL_TABLET | ORAL | 0 refills | Status: AC

## 2023-07-20 NOTE — Discharge Instructions (Addendum)
You are seen in the emergency department for right shoulder pain.  This is likely rotator cuff injury.  Your x-ray did not show any fracture or dislocation.  Your EKG showed you are in sinus rhythm.  Please use ibuprofen 3 times a day with food.  Sling for comfort although it will be to do some gentle range of motion exercises to prevent a frozen shoulder.  Contact orthopedics for follow-up.

## 2023-07-20 NOTE — ED Notes (Signed)
Discharge paperwork reviewed entirely with patient, including follow up care. Pain was under control. No prescriptions were called in, but all questions were addressed.  Pt verbalized understanding as well as all parties involved. No questions or concerns voiced at the time of discharge. No acute distress noted.   Pt ambulated out to PVA without incident or assistance.  

## 2023-07-20 NOTE — ED Provider Notes (Signed)
[facility] Provider Note   CSN: [id number] Arrival date & time: 07/20/23  4098     History  Chief Complaint  Patient presents with   Shoulder Pain    Teresa Ochoa is a 42 y.o. female.  She has a history of atrial fibrillation.  Right-hand-dominant.  Complaining of right shoulder pain after she awkwardly pushed herself off off the floor few days ago.  Intermittent but severe in her shoulder and sometimes radiates down her arm.  Mostly related to lifting her arm up.  No prior history of shoulder injury or problems.  No numbness or weakness.  No chest pain or shortness of breath.  The history is provided by the patient.  Shoulder Pain Location:  Shoulder Shoulder location:  R shoulder Injury: no   Pain details:    Quality:  Aching   Radiates to:  R upper arm, R elbow and R forearm   Severity:  Severe   Onset quality:  Gradual   Duration:  3 days   Timing:  Intermittent   Progression:  Unchanged Handedness:  Right-handed Dislocation: no   Prior injury to area:  No Worsened by:  Movement and stretching area Ineffective treatments:  Immobilization Associated symptoms: decreased range of motion and neck pain   Associated symptoms: no fever, no muscle weakness, no numbness and no tingling        Home Medications Prior to Admission medications   Medication Sig Start Date End Date Taking? Authorizing Provider  albuterol (VENTOLIN HFA) 108 (90 Base) MCG/ACT inhaler Inhale 2 puffs into the lungs every 6 (six) hours as needed for wheezing or shortness of breath. 06/02/21   [provider]  buprenorphine (SUBUTEX) 8 MG SUBL SL tablet Place 8 mg under the tongue in the morning and at bedtime.    [provider]  busPIRone (BUSPAR) 10 MG tablet Take 10 mg by mouth 3 (three) times daily. 06/08/22   [provider]  diltiazem (CARDIZEM) 30 MG tablet Take 1 tablet (30 mg total) by mouth every 6 (six) hours as  needed. Take as needed for heart palpitations Patient not taking: Reported on 06/12/2022 03/02/21   Christell Constant, MD  LINZESS 145 MCG CAPS capsule Take 145 mcg by mouth daily. 01/05/22   [provider]  rosuvastatin (CRESTOR) 10 MG tablet Take 1 tablet (10 mg total) by mouth daily. Patient not taking: Reported on 06/12/2022 03/01/21   Christell Constant, MD  topiramate (TOPAMAX) 25 MG tablet Take 1 tablet (25 mg total) by mouth 2 (two) times daily. 06/22/22 07/22/22  Blue, Soijett A, PA-C      Allergies    Ketorolac, Macrobid [nitrofurantoin monohyd macro], Other, Compazine [prochlorperazine], Macrobid [nitrofurantoin], Reglan [metoclopramide], Toradol [ketorolac tromethamine], Tylenol [acetaminophen], Tylenol [acetaminophen], and Compazine    Review of Systems   Review of Systems  Constitutional:  Negative for fever.  Respiratory:  Negative for shortness of breath.   Cardiovascular:  Negative for chest pain.  Musculoskeletal:  Positive for neck pain.  Neurological:  Negative for weakness and numbness.    Physical Exam Updated Vital Signs BP (!) 140/87 (BP Location: Left Arm)   Pulse (!) 102   Temp 99 F (37.2 C) (Oral)   Resp 20   Ht 5\' 4"  (1.626 m)   Wt 90.7 kg   SpO2 100%   BMI 34.33 kg/m  Physical Exam Vitals and nursing note reviewed.  Constitutional:      General: She  is not in acute distress.    Appearance: She is well-developed.  HENT:     Head: Normocephalic and atraumatic.  Eyes:     Conjunctiva/sclera: Conjunctivae normal.  Cardiovascular:     Rate and Rhythm: Regular rhythm. Tachycardia present.     Heart sounds: No murmur heard. Pulmonary:     Effort: Pulmonary effort is normal. No respiratory distress.     Breath sounds: Normal breath sounds.  Abdominal:     Palpations: Abdomen is soft.     Tenderness: There is no abdominal tenderness.  Musculoskeletal:        General: Tenderness present. No deformity.     Cervical back: Neck supple.      Comments: She is diffusely tender around her right shoulder both anteriorly and posteriorly.  She has pain with abduction and and internal and external rotation.  Elbow and wrist nontender.  Grip strength 5 out of 5.  Radial pulse 2+.  Normal sensation motor distal  Skin:    General: Skin is warm and dry.     Capillary Refill: Capillary refill takes less than 2 seconds.  Neurological:     General: No focal deficit present.     Mental Status: She is alert.     Sensory: No sensory deficit.     Motor: No weakness.     ED Results / Procedures / Treatments   Labs (all labs ordered are listed, but only abnormal results are displayed) Labs Reviewed - No data to display  EKG EKG Interpretation Date/Time:  Thursday July 20 2023 08:28:58 EDT Ventricular Rate:  88 PR Interval:  134 QRS Duration:  90 QT Interval:  381 QTC Calculation: 461 R Axis:   5  Text Interpretation: Sinus rhythm No significant change since prior 8/23 Confirmed by Meridee Score [phone]) on 07/20/2023 8:35:00 AM  Radiology DG Chest 2 View  Result Date: 07/20/2023 CLINICAL DATA:  Right shoulder pain EXAM: CHEST - 2 VIEW COMPARISON:  Chest radiograph 06/22/2022 FINDINGS: The cardiomediastinal silhouette is normal There is no focal consolidation or pulmonary edema. There is no pleural effusion or pneumothorax There is no acute osseous abnormality. IMPRESSION: No radiographic evidence of acute cardiopulmonary process. Electronically Signed   By: Lesia Hausen M.D.   On: 07/20/2023 08:59   DG Shoulder Right  Result Date: 07/20/2023 CLINICAL DATA:  Right shoulder pain EXAM: RIGHT SHOULDER - 2+ VIEW COMPARISON:  None Available. FINDINGS: There is no evidence of fracture or dislocation. Mild degenerative change within the glenohumeral joint with spurring along the inferior glenoid. Soft tissues are unremarkable. IMPRESSION: 1. No acute findings. 2. Mild glenohumeral osteoarthritis. Electronically Signed   By: Signa Kell  M.D.   On: 07/20/2023 08:56    Procedures Procedures    Medications Ordered in ED Medications - No data to display  ED Course/ Medical Decision Making/ A&P Clinical Course as of 07/20/23 1725  Thu Jul 20, 2023  0915 Reviewed results of workup with patient.  She said she is already reached out to [facility] and they said for her to call them back once she finishes up in the ED.  She cannot take ibuprofen so I recommended that.  I also recommended a shoulder harness to help support the area.  Return instructions discussed [MB]    Clinical Course User Index [MB] Terrilee Files, MD  Medical Decision Making Amount and/or Complexity of Data Reviewed Radiology: ordered.   This patient complains of right shoulder pain; this involves an extensive number of treatment Options and is a complaint that carries with it a high risk of complications and morbidity. The differential includes fracture, dislocation, rotator cuff injury, tendinitis, septic joint, pneumonia, PE, ACS  I ordered imaging studies which included chest x-ray and right shoulder x-ray and I independently    visualized and interpreted imaging which showed no acute findings Previous records obtained and reviewed in epic no recent admissions although patient is on buprenorphine Cardiac monitoring reviewed, sinus tachycardia Social determinants considered, increased stress Critical Interventions: None  After the interventions stated above, I reevaluated the patient and found patient to be neurovascularly intact Admission and further testing considered, no indications for admission or further workup at this time.  Feel this is likely musculoskeletal as it is very reproducible with palpation and range of motion of shoulder.  She is in sinus rhythm chest x-ray is clear.  She will need close follow-up with orthopedics as she will likely require possible injection or referral to MRI and physical  therapy.  Return instructions discussed.         Final Clinical Impression(s) / ED Diagnoses Final diagnoses:  Acute pain of right shoulder    Rx / DC Orders ED Discharge Orders     None         Terrilee Files, MD 07/20/23 1727

## 2023-07-20 NOTE — ED Triage Notes (Addendum)
Here by POV from home for R shoulder pain. Onset Tuesday. No known fall or injury. No distant injury. No h/o similar. Worse with movement. Unable to lift arm. Shooting pain down arm. Denies numbness. Feels heavy. No relief with ibuprofen last taken at 0200. Wearing OTC sling. Not seen for same previously. H/o afib. Not on blood thinner. Denies sob. Pain radiates across R breast.

## 2023-07-20 NOTE — Progress Notes (Signed)
Office Visit Note   Patient: Teresa Ochoa           Date of Birth: 08-23-1981           MRN: [id number] Visit Date: 07/20/2023              Requested by: Vivien Presto, MD [phone] [place],  [place] [address] PCP: Vivien Presto, MD   Assessment & Plan: Visit Diagnoses:  1. Acute pain of right shoulder     Plan: Patient is a pleasant 42 year old woman who is accompanied by her husband she is right-hand dominant.  She has a 2-day history of severe pain in her right shoulder.  She does not have any injury that she can think of the only thing that comes to mind is that she was pushing herself off the floor a couple nights ago after she was painting her toenails.  She began developing pain after that.  Significant enough that she went to the emergency room today there there x-rays were negative.  She denies any neck pain specifically but does have some pain going into her scapula on the right that is been around for a while.  No history of trauma or dislocation of the shoulder.  She denies any paresthesias but does sometimes say her arm from her hand all the way up to her neck hurts.  She has a difficult exam today she only activates her arm to about 90 degrees does not do internal rotation behind her back.  Her grip strength is intact and she is neurovascularly intact her compartments are soft and nontender she has no ecchymosis she has fairly good resistive strength with abduction external/internal rotation just quite painful.  While certainly she could have a shoulder injury mechanism is not that significant especially for 42 year old.  Could also have a neck issue.  We will go forward with a steroid Dosepak and see if this helps her.  She will follow-up with me on Tuesday.  If for some reason she has increasing pain or paresthesias she is to go to the emergency room. She knows not to take the anti-inflammatories were on the steroids.  Will give her very mild right muscle  relaxant as well.  If she does not get good relief would get an x-ray of her neck.  Would also consider an MRI of her shoulder with arthrogram Follow-Up Instructions: No follow-ups on file.   Orders:  No orders of the defined types were placed in this encounter.  No orders of the defined types were placed in this encounter.     Procedures: No procedures performed   Clinical Data: No additional findings.   Subjective: Chief Complaint  Patient presents with   Right Shoulder - Pain    HPI patient is a pleasant 42 year old woman without previous history of shoulder difficulties.  She comes in today with a 2-day history of shoulder pain running down her arm.  Denies any injury other than she was pushing herself off the floor a couple days ago after doing her nails.  Denies any fever chills or paresthesias.  Was seen in the ER earlier this morning.  X-rays of her shoulder there were unremarkable.  Review of Systems  All other systems reviewed and are negative.    Objective: Vital Signs: There were no vitals taken for this visit.  Physical Exam Constitutional:      Appearance: Normal appearance.  Pulmonary:     Effort: Pulmonary effort  is normal.  Skin:    General: Skin is warm and dry.  Neurological:     Mental Status: She is alert.     Ortho Exam Examination of her right shoulder she has a strong pulse distally her grip strength is intact she has good flexion and extension of her arm.  She has good abduction strength only limited by pain.  She has active elevation forward of her arm but she can sustain at about 90 degrees and then it is just too painful.  She does have some tenderness over the scapula.  She has good range of motion of her neck which does not reproduce any radicular symptoms.  She has good biceps and triceps strength.  No erythema no ecchymosis Specialty Comments:  No specialty comments available.  Imaging: DG Chest 2 View  Result Date:  07/20/2023 CLINICAL DATA:  Right shoulder pain EXAM: CHEST - 2 VIEW COMPARISON:  Chest radiograph 06/22/2022 FINDINGS: The cardiomediastinal silhouette is normal There is no focal consolidation or pulmonary edema. There is no pleural effusion or pneumothorax There is no acute osseous abnormality. IMPRESSION: No radiographic evidence of acute cardiopulmonary process. Electronically Signed   By: Lesia Hausen M.D.   On: 07/20/2023 08:59   DG Shoulder Right  Result Date: 07/20/2023 CLINICAL DATA:  Right shoulder pain EXAM: RIGHT SHOULDER - 2+ VIEW COMPARISON:  None Available. FINDINGS: There is no evidence of fracture or dislocation. Mild degenerative change within the glenohumeral joint with spurring along the inferior glenoid. Soft tissues are unremarkable. IMPRESSION: 1. No acute findings. 2. Mild glenohumeral osteoarthritis. Electronically Signed   By: Signa Kell M.D.   On: 07/20/2023 08:56     PMFS History: Patient Active Problem List   Diagnosis Date Noted   Pain in right shoulder 07/20/2023   Familial hypercholesterolemia 02/24/2021   Syncope 02/24/2021   Palpitations 02/24/2021   Chest pain of uncertain etiology 02/24/2021   History of gestational diabetes 02/24/2021   Chronic leg pain 03/31/2016   Chronic back pain 03/31/2016   Migraines 01/21/2016   Enlarged liver    Past Medical History:  Diagnosis Date   A-fib [facility])    Chest pain of unknown etiology    Chronic back pain    Chronic leg pain    Chronic pain syndrome    Depression    Reports more depression since the accident    Enlarged liver    Gastroparesis    Heart abnormality 08/03/2016   place on Heart monitor for Afib   Migraine    Migraine    Migraines 01/21/2016   MS (multiple sclerosis) (HCC)    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)    Palpitations    Substance abuse (HCC)     Family History  Problem Relation Age of Onset   Heart disease Maternal Grandmother    Diabetes Maternal Grandmother     Hypertension Maternal Grandmother     Past Surgical History:  Procedure Laterality Date   ABDOMINAL HYSTERECTOMY     APPENDECTOMY     CHOLECYSTECTOMY     GALLBLADDER SURGERY     JOINT REPLACEMENT     SALIVARY GLAND SURGERY     TUBAL LIGATION     Social History   Occupational History   Not on file  Tobacco Use   Smoking status: Former    Current packs/day: 0.00    Types: Cigarettes    Quit date: 03/27/2022    Years since quitting: 1.3   Smokeless tobacco: Never  Vaping Use   Vaping status: Some Days   Substances: Nicotine  Substance and Sexual Activity   Alcohol use: Not Currently    Comment: occ   Drug use: No   Sexual activity: Not on file

## 2023-07-25 ENCOUNTER — Encounter: Payer: Self-pay | Admitting: Physician Assistant

## 2023-07-25 ENCOUNTER — Ambulatory Visit: Payer: Managed Care, Other (non HMO) | Admitting: Physician Assistant

## 2023-07-25 DIAGNOSIS — M25511 Pain in right shoulder: Secondary | ICD-10-CM | POA: Diagnosis not present

## 2023-07-25 MED ORDER — MELOXICAM 15 MG PO TABS: 15.0000 mg | ORAL_TABLET | Freq: Every day | ORAL | 0 refills | Status: DC

## 2023-07-25 NOTE — Progress Notes (Signed)
Office Visit Note   Patient: Teresa Ochoa           Date of Birth: [birth year]           MRN: [id number] Visit Date: 07/25/2023              Requested by: Vivien Presto, MD [phone] [place],  [place] [address] PCP: Vivien Presto, MD  Chief Complaint  Patient presents with   Right Shoulder - Follow-up      HPI: Teresa Ochoa is here in follow-up for her right shoulder.  She presented last week with severe pain in her right shoulder and scapula after pushing herself up off the floor.  Had both findings consistent with shoulder and neck so I placed her on oral steroids.  She reports she is doing much better.  She has been trying to do some of the range of motion exercises I discussed with her.  Denies any fever or chills.  Assessment & Plan: Visit Diagnoses: Right arm pain  Plan: She has taken ibuprofen in the past without difficulty.  I suggest that when she finishes the steroid to go ahead and take meloxicam.  She has an allergy to Toradol but apparently has done fine with ibuprofen.  Will follow-up in 2 weeks.  Will call me if the symptoms return.  She is also being worked up for MS and is going to see a neurologist in the next month or so.  I did confirm she is taken meloxicam without difficulty  Follow-Up Instructions: No follow-ups on file.   Ortho Exam  Patient is alert, oriented, no adenopathy, well-dressed, normal affect, normal respiratory effort. Examination of her right shoulder she has active forward elevation to 170 degrees she does have some internal rotation behind her back she has good strength negative impingement findings she does have some tenderness over the right scapula inferior border.  She is neurovascular intact.  Imaging: No results found. No images are attached to the encounter.  Labs: Lab Results  Component Value Date   ESRSEDRATE 8 04/22/2007   REPTSTATUS 07/02/2009 FINAL 06/30/2009   CULT INSIGNIFICANT GROWTH 06/30/2009     Lab  Results  Component Value Date   ALBUMIN 4.5 06/22/2022   ALBUMIN 4.0 06/12/2022   ALBUMIN 4.6 01/25/2022    Lab Results  Component Value Date   MG 1.9 06/22/2022   MG 1.9 12/09/2021   No results found for: "VD25OH"  No results found for: "PREALBUMIN"    Latest Ref Rng & Units 06/22/2022    2:02 PM 06/12/2022    8:50 PM 06/06/2022   10:47 AM  CBC EXTENDED  WBC 4.0 - 10.5 K/uL 12.4  11.7  10.3   RBC 3.87 - 5.11 MIL/uL 5.01  4.03  4.47   Hemoglobin 12.0 - 15.0 g/dL 78.2  95.6  21.3   HCT 36.0 - 46.0 % 44.5  36.4  38.9   Platelets 150 - 400 K/uL 322  310  316   NEUT# 1.7 - 7.7 K/uL 8.1  8.2  8.2   Lymph# 0.7 - 4.0 K/uL 3.2  2.6  1.5      There is no height or weight on file to calculate BMI.  Orders:  No orders of the defined types were placed in this encounter.  Meds ordered this encounter  Medications   meloxicam (MOBIC) 15 MG tablet    Sig: Take 1 tablet (15 mg total) by mouth daily.  Dispense:  30 tablet    Refill:  0     Procedures: No procedures performed  Clinical Data: No additional findings.  ROS:  All other systems negative, except as noted in the HPI. Review of Systems  Objective: Vital Signs: There were no vitals taken for this visit.  Specialty Comments:  No specialty comments available.  PMFS History: Patient Active Problem List   Diagnosis Date Noted   Pain in right shoulder 07/20/2023   Familial hypercholesterolemia 02/24/2021   Syncope 02/24/2021   Palpitations 02/24/2021   Chest pain of uncertain etiology 02/24/2021   History of gestational diabetes 02/24/2021   Chronic leg pain 03/31/2016   Chronic back pain 03/31/2016   Migraines 01/21/2016   Enlarged liver    Past Medical History:  Diagnosis Date   A-fib [facility])    Chest pain of unknown etiology    Chronic back pain    Chronic leg pain    Chronic pain syndrome    Depression    Reports more depression since the accident    Enlarged liver    Gastroparesis    Heart  abnormality 08/03/2016   place on Heart monitor for Afib   Migraine    Migraine    Migraines 01/21/2016   MS (multiple sclerosis) (HCC)    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)    Palpitations    Substance abuse (HCC)     Family History  Problem Relation Age of Onset   Heart disease Maternal Grandmother    Diabetes Maternal Grandmother    Hypertension Maternal Grandmother     Past Surgical History:  Procedure Laterality Date   ABDOMINAL HYSTERECTOMY     APPENDECTOMY     CHOLECYSTECTOMY     GALLBLADDER SURGERY     JOINT REPLACEMENT     SALIVARY GLAND SURGERY     TUBAL LIGATION     Social History   Occupational History   Not on file  Tobacco Use   Smoking status: Former    Current packs/day: 0.00    Types: Cigarettes    Quit date: 03/27/2022    Years since quitting: 1.3   Smokeless tobacco: Never  Vaping Use   Vaping status: Some Days   Substances: Nicotine  Substance and Sexual Activity   Alcohol use: Not Currently    Comment: occ   Drug use: No   Sexual activity: Not on file

## 2023-07-29 ENCOUNTER — Other Ambulatory Visit: Payer: Self-pay

## 2023-07-29 ENCOUNTER — Emergency Department (HOSPITAL_BASED_OUTPATIENT_CLINIC_OR_DEPARTMENT_OTHER)
Admission: EM | Admit: 2023-07-29 | Discharge: 2023-07-29 | Disposition: A | Discharge status: Home / Self Care | Payer: Managed Care, Other (non HMO) | Attending: Emergency Medicine | Admitting: Emergency Medicine

## 2023-07-29 ENCOUNTER — Encounter (HOSPITAL_BASED_OUTPATIENT_CLINIC_OR_DEPARTMENT_OTHER): Payer: Self-pay

## 2023-07-29 DIAGNOSIS — K625 Hemorrhage of anus and rectum: Secondary | ICD-10-CM | POA: Diagnosis present

## 2023-07-29 LAB — COMPREHENSIVE METABOLIC PANEL
ALT: 22 U/L (ref 0–44)
AST: 20 U/L (ref 15–41)
Albumin: 3.8 g/dL (ref 3.5–5.0)
Alkaline Phosphatase: 59 U/L (ref 38–126)
Anion gap: 6 (ref 5–15)
BUN: 9 mg/dL (ref 6–20)
CO2: 29 mmol/L (ref 22–32)
Calcium: 8.6 mg/dL — ABNORMAL LOW (ref 8.9–10.3)
Chloride: 100 mmol/L (ref 98–111)
Creatinine, Ser: 0.69 mg/dL (ref 0.44–1.00)
GFR, Estimated: >60 mL/min (ref >60)
Glucose, Bld: 107 mg/dL — ABNORMAL HIGH (ref 70–99)
Potassium: 3.5 mmol/L (ref 3.5–5.1)
Sodium: 135 mmol/L (ref 135–145)
Total Bilirubin: 0.4 mg/dL (ref 0.3–1.2)
Total Protein: 6.6 g/dL (ref 6.5–8.1)

## 2023-07-29 LAB — CBC WITH DIFFERENTIAL/PLATELET
Abs Immature Granulocytes: 0.1 10*3/uL — ABNORMAL HIGH (ref 0.00–0.07)
Basophils Absolute: 0.1 10*3/uL (ref 0.0–0.1)
Basophils Relative: 1 %
Eosinophils Absolute: 0.3 10*3/uL (ref 0.0–0.5)
Eosinophils Relative: 2 %
HCT: 33.2 % — ABNORMAL LOW (ref 36.0–46.0)
Hemoglobin: 10.7 g/dL — ABNORMAL LOW (ref 12.0–15.0)
Immature Granulocytes: 1 %
Lymphocytes Relative: 31 %
Lymphs Abs: 3.5 10*3/uL (ref 0.7–4.0)
MCH: 28.2 pg (ref 26.0–34.0)
MCHC: 32.2 g/dL (ref 30.0–36.0)
MCV: 87.6 fL (ref 80.0–100.0)
Monocytes Absolute: 0.9 10*3/uL (ref 0.1–1.0)
Monocytes Relative: 8 %
Neutro Abs: 6.6 10*3/uL (ref 1.7–7.7)
Neutrophils Relative %: 57 %
Platelets: 258 10*3/uL (ref 150–400)
RBC: 3.79 MIL/uL — ABNORMAL LOW (ref 3.87–5.11)
RDW: 13.6 % (ref 11.5–15.5)
WBC: 11.5 10*3/uL — ABNORMAL HIGH (ref 4.0–10.5)
nRBC: 0 % (ref 0.0–0.2)

## 2023-07-29 LAB — LIPASE, BLOOD: Lipase: 25 U/L (ref 11–51)

## 2023-07-29 NOTE — ED Triage Notes (Signed)
Pov from home, a&o x 4, GCS 15, amb to room  Pt used rectal applicator for meds and been bleeding since then, approx 3-4 days ago, now having left sided abd pain that radiates to back, endorses nausea that began today.

## 2023-07-29 NOTE — ED Provider Notes (Signed)
Teresa Ochoa Provider Note   CSN: [id number] Arrival date & time: 07/29/23  2129     History  Chief Complaint  Patient presents with   Rectal Bleeding   Abdominal Pain    Teresa Ochoa is a 42 y.o. female.  42 yo F with a chief complaints of rectal bleeding.  Symptoms been going on for about 3 to 4 days now.  Feels like it is quite copious.  She sent a message to her gastroenterologist to recommend she come to the ED to have her hemoglobin checked.  She has a history of rectal bleeding and has had it off and on.  She had hurt her shoulder until is having trouble using her suppositories for constipation and so used a applicator device that she was worried maybe injured her.  She felt bloated and feels like her abdomen is little bit uncomfortable.  No fevers no vomiting.   Rectal Bleeding Associated symptoms: abdominal pain   Abdominal Pain Associated symptoms: hematochezia        Home Medications Prior to Admission medications   Medication Sig Start Date End Date Taking? Authorizing Provider  albuterol (VENTOLIN HFA) 108 (90 Base) MCG/ACT inhaler Inhale 2 puffs into the lungs every 6 (six) hours as needed for wheezing or shortness of breath. 06/02/21   [provider]  buprenorphine (SUBUTEX) 8 MG SUBL SL tablet Place 8 mg under the tongue in the morning and at bedtime.    [provider]  busPIRone (BUSPAR) 10 MG tablet Take 10 mg by mouth 3 (three) times daily. 06/08/22   [provider]  diltiazem (CARDIZEM) 30 MG tablet Take 1 tablet (30 mg total) by mouth every 6 (six) hours as needed. Take as needed for heart palpitations 03/02/21   Christell Constant, MD  LINZESS 145 MCG CAPS capsule Take 145 mcg by mouth daily. 01/05/22   [provider]  meloxicam (MOBIC) 15 MG tablet Take 1 tablet (15 mg total) by mouth daily. 07/25/23   Persons, [place], PA  methocarbamol (ROBAXIN) 500 MG tablet Take 1  tablet (500 mg total) by mouth every 6 (six) hours as needed for muscle spasms. 07/20/23   Persons, [place], PA  methylPREDNISolone (MEDROL DOSEPAK) 4 MG TBPK tablet Take as directed with food 07/20/23   Persons, [place], PA  rosuvastatin (CRESTOR) 10 MG tablet Take 1 tablet (10 mg total) by mouth daily. 03/01/21   Christell Constant, MD  topiramate (TOPAMAX) 25 MG tablet Take 1 tablet (25 mg total) by mouth 2 (two) times daily. 06/22/22 07/22/22  Blue, Soijett A, PA-C      Allergies    Ketorolac, Macrobid [nitrofurantoin monohyd macro], Other, Compazine [prochlorperazine], Macrobid [nitrofurantoin], Reglan [metoclopramide], Toradol [ketorolac tromethamine], Tylenol [acetaminophen], Tylenol [acetaminophen], and Compazine    Review of Systems   Review of Systems  Gastrointestinal:  Positive for abdominal pain and hematochezia.    Physical Exam Updated Vital Signs BP (!) 159/94   Pulse 91   Temp 98 F (36.7 C)   Resp 18   Ht 5\' 4"  (1.626 m)   Wt 90.7 kg   SpO2 98%   BMI 34.33 kg/m  Physical Exam Vitals and nursing note reviewed.  Constitutional:      General: She is not in acute distress.    Appearance: She is well-developed. She is not diaphoretic.  HENT:     Head: Normocephalic and atraumatic.  Eyes:     Pupils:  Pupils are equal, round, and reactive to light.  Cardiovascular:     Rate and Rhythm: Normal rate and regular rhythm.     Heart sounds: No murmur heard.    No friction rub. No gallop.  Pulmonary:     Effort: Pulmonary effort is normal.     Breath sounds: No wheezing or rales.  Abdominal:     General: There is no distension.     Palpations: Abdomen is soft.     Tenderness: There is no abdominal tenderness.  Genitourinary:    Comments: Hemorrhoids, no actively bleeding.  No blood in the vault. Musculoskeletal:        General: No tenderness.     Cervical back: Normal range of motion and neck supple.  Skin:    General: Skin is warm and dry.  Neurological:      Mental Status: She is alert and oriented to person, place, and time.  Psychiatric:        Behavior: Behavior normal.     ED Results / Procedures / Treatments   Labs (all labs ordered are listed, but only abnormal results are displayed) Labs Reviewed  CBC WITH DIFFERENTIAL/PLATELET - Abnormal; Notable for the following components:      Result Value   WBC 11.5 (*)    RBC 3.79 (*)    Hemoglobin 10.7 (*)    HCT 33.2 (*)    Abs Immature Granulocytes 0.10 (*)    All other components within normal limits  COMPREHENSIVE METABOLIC PANEL - Abnormal; Notable for the following components:   Glucose, Bld 107 (*)    Calcium 8.6 (*)    All other components within normal limits  LIPASE, BLOOD    EKG None  Radiology No results found.  Procedures Procedures    Medications Ordered in ED Medications - No data to display  ED Course/ Medical Decision Making/ A&P                                 Medical Decision Making Amount and/or Complexity of Data Reviewed Labs: ordered.   42 yo F with a chief complaints of rectal bleeding.  This is an off-and-on problem for her.  Worsening over the past 4 days or so.  She was worried that maybe she had a rectal injury from using an applicator for her chronic suppositories for constipation.  No obvious rectal injury on my exam.  Will check hemoglobin here.  Patient's hemoglobin is a bit lower than last check.  Last check 5 months ago was 13.4 and today is 10.  She is well-appearing nontoxic.  Not tachycardic.  LFTs and lipase are unremarkable.  I discussed the results with the patient.  Encouraged her to follow-up with her gastroenterologist in the office.  Encouraged to return for worsening bleeding.  11:07 PM:  I have discussed the diagnosis/risks/treatment options with the patient and family.  Evaluation and diagnostic testing in the emergency department does not suggest an emergent condition requiring admission or immediate intervention beyond  what has been performed at this time.  They will follow up with PCP, GI. We also discussed returning to the ED immediately if new or worsening sx occur. We discussed the sx which are most concerning (e.g., sudden worsening pain, fever, inability to tolerate by mouth) that necessitate immediate return. Medications administered to the patient during their visit and any new prescriptions provided to the patient are listed below.  Medications  given during this visit Medications - No data to display   The patient appears reasonably screen and/or stabilized for discharge and I doubt any other medical condition or other [facility] requiring further screening, evaluation, or treatment in the ED at this time prior to discharge.          Final Clinical Impression(s) / ED Diagnoses Final diagnoses:  BRBPR (bright red blood per rectum)    Rx / DC Orders ED Discharge Orders     None         Melene Plan, DO 07/29/23 2308

## 2023-07-29 NOTE — Discharge Instructions (Signed)
Please return for worsening bleeding or if you feel you get a pass out.  Please call your gastroenterologist on Monday.

## 2023-08-10 ENCOUNTER — Ambulatory Visit (HOSPITAL_BASED_OUTPATIENT_CLINIC_OR_DEPARTMENT_OTHER): Payer: Managed Care, Other (non HMO) | Admitting: Physician Assistant

## 2023-08-10 ENCOUNTER — Ambulatory Visit: Payer: Managed Care, Other (non HMO) | Admitting: Physician Assistant

## 2023-09-18 ENCOUNTER — Other Ambulatory Visit: Payer: Self-pay | Admitting: Physician Assistant

## 2024-09-07 ENCOUNTER — Emergency Department (HOSPITAL_COMMUNITY)

## 2024-09-07 ENCOUNTER — Other Ambulatory Visit: Payer: Self-pay

## 2024-09-07 ENCOUNTER — Emergency Department (HOSPITAL_COMMUNITY)
Admission: EM | Admit: 2024-09-07 | Discharge: 2024-09-07 | Disposition: A | Discharge status: Home / Self Care | Attending: Emergency Medicine | Admitting: Emergency Medicine

## 2024-09-07 DIAGNOSIS — W19XXXA Unspecified fall, initial encounter: Secondary | ICD-10-CM

## 2024-09-07 DIAGNOSIS — S5011XA Contusion of right forearm, initial encounter: Secondary | ICD-10-CM | POA: Diagnosis not present

## 2024-09-07 DIAGNOSIS — M25521 Pain in right elbow: Secondary | ICD-10-CM | POA: Insufficient documentation

## 2024-09-07 DIAGNOSIS — M25572 Pain in left ankle and joints of left foot: Secondary | ICD-10-CM | POA: Diagnosis not present

## 2024-09-07 DIAGNOSIS — M79631 Pain in right forearm: Secondary | ICD-10-CM | POA: Diagnosis present

## 2024-09-07 DIAGNOSIS — M25511 Pain in right shoulder: Secondary | ICD-10-CM | POA: Insufficient documentation

## 2024-09-07 DIAGNOSIS — M79672 Pain in left foot: Secondary | ICD-10-CM | POA: Diagnosis not present

## 2024-09-07 DIAGNOSIS — S40021A Contusion of right upper arm, initial encounter: Secondary | ICD-10-CM

## 2024-09-07 DIAGNOSIS — R0789 Other chest pain: Secondary | ICD-10-CM | POA: Diagnosis not present

## 2024-09-07 DIAGNOSIS — W109XXA Fall (on) (from) unspecified stairs and steps, initial encounter: Secondary | ICD-10-CM | POA: Insufficient documentation

## 2024-09-07 MED ORDER — KETAMINE HCL 50 MG/5ML IJ SOSY
0.3000 mg/kg | PREFILLED_SYRINGE | Freq: Once | INTRAMUSCULAR | Status: AC
  Administered 2024-09-07: 24 mg via INTRAVENOUS
  Filled 2024-09-07: qty 5

## 2024-09-07 MED ORDER — HYDROMORPHONE HCL 1 MG/ML IJ SOLN
1.0000 mg | Freq: Once | INTRAMUSCULAR | Status: AC
  Administered 2024-09-07: 1 mg via INTRAVENOUS
  Filled 2024-09-07: qty 1

## 2024-09-07 MED ORDER — ONDANSETRON HCL 4 MG/2ML IJ SOLN
4.0000 mg | Freq: Once | INTRAMUSCULAR | Status: AC
  Administered 2024-09-07: 4 mg via INTRAVENOUS
  Filled 2024-09-07: qty 2

## 2024-09-07 NOTE — ED Notes (Signed)
 Pt clarified she fell at dog groomers house not her own.

## 2024-09-07 NOTE — ED Notes (Signed)
 PT refused C -collar, education refused.

## 2024-09-07 NOTE — Discharge Instructions (Signed)
 Wear the sling for the next few days but make sure you are not wearing it all the time after that to avoid it becoming a frozen shoulder.  You can just place a little Vaseline over the scratches on your arm and that should scab over and heal without any problem.  You may want to use some lidocaine patches or Voltaren  gel for pain in addition to the medications you take at home.  You can add a muscle relaxer if you want.

## 2024-09-07 NOTE — ED Provider Notes (Signed)
 [place] EMERGENCY DEPARTMENT AT [facility] Provider Note   CSN: [id number] Arrival date & time: 09/07/24  1424     Patient presents with: No chief complaint on file.   Teresa Ochoa is a 43 y.o. female.   Patient is a 43 year old female with a history of MS, substance abuse on buprenorphine, migraines, gastroparesis and atrial fibrillation not on anticoagulation who is presenting today after falling down 3-4 steps and landing on her right arm.  She denies any head injury or loss of consciousness.  She is having some pain in her left foot but otherwise her legs feel okay.  Patient reports anytime she moves her arm it feels like the bones are clicking together and also complaining of pain to her clavicle and ribs on the right side.  She denies any shortness of breath or abdominal pain  The history is provided by the patient, the EMS personnel and the spouse.       Prior to Admission medications   Medication Sig Start Date End Date Taking? Authorizing Provider  albuterol  (VENTOLIN  HFA) 108 (90 Base) MCG/ACT inhaler Inhale 2 puffs into the lungs every 6 (six) hours as needed for wheezing or shortness of breath. 06/02/21   [provider]  buprenorphine (SUBUTEX) 8 MG SUBL SL tablet Place 8 mg under the tongue in the morning and at bedtime.    [provider]  busPIRone (BUSPAR) 10 MG tablet Take 10 mg by mouth 3 (three) times daily. 06/08/22   [provider]  diltiazem  (CARDIZEM ) 30 MG tablet Take 1 tablet (30 mg total) by mouth every 6 (six) hours as needed. Take as needed for heart palpitations 03/02/21   Santo Stanly LABOR, MD  LINZESS 145 MCG CAPS capsule Take 145 mcg by mouth daily. 01/05/22   [provider]  meloxicam  (MOBIC ) 15 MG tablet Take 1 tablet (15 mg total) by mouth daily. 09/19/23   Persons, Ronal Dragon, PA  methocarbamol  (ROBAXIN ) 500 MG tablet Take 1 tablet (500 mg total) by mouth every 6 (six) hours as needed for muscle  spasms. 07/20/23   Persons, Ronal Dragon, PA  methylPREDNISolone  (MEDROL  DOSEPAK) 4 MG TBPK tablet Take as directed with food 07/20/23   Persons, Ronal Dragon, PA  rosuvastatin  (CRESTOR ) 10 MG tablet Take 1 tablet (10 mg total) by mouth daily. 03/01/21   Chandrasekhar, Stanly LABOR, MD  topiramate  (TOPAMAX ) 25 MG tablet Take 1 tablet (25 mg total) by mouth 2 (two) times daily. 06/22/22 07/22/22  Blue, Soijett A, PA-C    Allergies: Ketorolac, Macrobid [nitrofurantoin monohyd macro], Other, Compazine [prochlorperazine], Macrobid [nitrofurantoin], Reglan  [metoclopramide ], Toradol [ketorolac tromethamine], Tylenol  [acetaminophen ], Tylenol  [acetaminophen ], and Compazine    Review of Systems  Updated Vital Signs BP (!) 142/81   Pulse 84   Temp 98.4 F (36.9 C) (Oral)   Resp 20   Ht 5' 3 (1.6 m)   Wt 78.9 kg   SpO2 99%   BMI 30.82 kg/m   Physical Exam Vitals and nursing note reviewed.  Constitutional:      General: She is not in acute distress.    Appearance: She is well-developed.     Comments: Appears uncomfortable  HENT:     Head: Normocephalic and atraumatic.  Eyes:     Pupils: Pupils are equal, round, and reactive to light.  Cardiovascular:     Rate and Rhythm: Normal rate and regular rhythm.     Pulses: Normal pulses.     Heart sounds: Normal heart  sounds. No murmur heard.    No friction rub.  Pulmonary:     Effort: Pulmonary effort is normal.     Breath sounds: Normal breath sounds. No wheezing or rales.  Chest:     Chest wall: Tenderness present.    Abdominal:     General: Bowel sounds are normal. There is no distension.     Palpations: Abdomen is soft.     Tenderness: There is no abdominal tenderness. There is no guarding or rebound.  Musculoskeletal:        General: Tenderness and signs of injury present.     Right shoulder: Tenderness present. No deformity. Decreased range of motion.     Right upper arm: Tenderness and bony tenderness present.     Right elbow: No deformity.  Decreased range of motion. Tenderness present.     Right wrist: Normal.       Arms:     Cervical back: Normal range of motion and neck supple.     Left foot: Bony tenderness present. Normal pulse.       Feet:     Comments: No edema  Skin:    General: Skin is warm and dry.     Findings: No rash.  Neurological:     Mental Status: She is alert and oriented to person, place, and time.     Cranial Nerves: No cranial nerve deficit.  Psychiatric:        Behavior: Behavior normal.     (all labs ordered are listed, but only abnormal results are displayed) Labs Reviewed - No data to display  EKG: None  Radiology: [facility] Chest Port 1 View Result Date: 09/07/2024 EXAM: 1 VIEW(S) XRAY OF THE CHEST 09/07/2024 05:14:55 PM COMPARISON: 07/20/2023 CLINICAL HISTORY: Fall onto right side. Complaints of pain in the neck, right middle chest, and right arm. Large deformity in the upper right arm, which feels loose. Chest pain is worse with palpation and breathing. FINDINGS: LUNGS AND PLEURA: No focal pulmonary opacity. No pulmonary edema. No pleural effusion. No pneumothorax. HEART AND MEDIASTINUM: No acute abnormality of the cardiac and mediastinal silhouettes. BONES AND SOFT TISSUES: No acute osseous abnormality. IMPRESSION: 1. Normal chest radiograph. Electronically signed by: Norman Gatlin MD 09/07/2024 05:30 PM EDT RP Workstation: [id number]   DG Shoulder Right Result Date: 09/07/2024 CLINICAL DATA:  Fall and trauma to the right upper extremity. EXAM: RIGHT SHOULDER - 2+ VIEW; RIGHT HUMERUS - 2+ VIEW; RIGHT ELBOW - 2 VIEW COMPARISON:  Right shoulder radiograph dated 07/20/2023. FINDINGS: Evaluation of the elbows limited due to positioning. No acute fracture or dislocation. The bones are well mineralized. No arthritic changes. The soft tissues are unremarkable. IMPRESSION: No acute fracture or dislocation. Electronically Signed   By: Vanetta Chou M.D.   On: 09/07/2024 17:29   DG Elbow 2 Views  Right Result Date: 09/07/2024 CLINICAL DATA:  Fall and trauma to the right upper extremity. EXAM: RIGHT SHOULDER - 2+ VIEW; RIGHT HUMERUS - 2+ VIEW; RIGHT ELBOW - 2 VIEW COMPARISON:  Right shoulder radiograph dated 07/20/2023. FINDINGS: Evaluation of the elbows limited due to positioning. No acute fracture or dislocation. The bones are well mineralized. No arthritic changes. The soft tissues are unremarkable. IMPRESSION: No acute fracture or dislocation. Electronically Signed   By: Vanetta Chou M.D.   On: 09/07/2024 17:29   DG Humerus Right Result Date: 09/07/2024 CLINICAL DATA:  Fall and trauma to the right upper extremity. EXAM: RIGHT SHOULDER - 2+ VIEW; RIGHT HUMERUS - 2+ VIEW;  RIGHT ELBOW - 2 VIEW COMPARISON:  Right shoulder radiograph dated 07/20/2023. FINDINGS: Evaluation of the elbows limited due to positioning. No acute fracture or dislocation. The bones are well mineralized. No arthritic changes. The soft tissues are unremarkable. IMPRESSION: No acute fracture or dislocation. Electronically Signed   By: Vanetta Chou M.D.   On: 09/07/2024 17:29   DG Foot Complete Left Result Date: 09/07/2024 EXAM: 3 or more VIEW(S) XRAY OF THE LEFT FOOT 09/07/2024 05:14:55 PM COMPARISON: 1 / 16 / 16 CLINICAL HISTORY: Fall with pain. Patient was pulled down stairs, landing with a hard impact on the cement landing on her right side. Associated pain reported in the neck, right middle chest, and right arm. FINDINGS: BONES AND JOINTS: No acute fracture. Plantar and achilles calcaneal spurs. No joint dislocation. SOFT TISSUES: The soft tissues are unremarkable. IMPRESSION: 1. No acute osseous abnormality. 2. Calcaneal spurs. Electronically signed by: Norman Gatlin MD 09/07/2024 05:29 PM EDT RP Workstation: [id number]     Procedures   Medications Ordered in the ED  HYDROmorphone  (DILAUDID ) injection 1 mg (1 mg Intravenous Given 09/07/24 1448)  ondansetron  (ZOFRAN ) injection 4 mg (4 mg Intravenous Given  09/07/24 1449)  ketamine 50 mg in normal saline 5 mL (10 mg/mL) syringe (24 mg Intravenous Given 09/07/24 1601)  HYDROmorphone  (DILAUDID ) injection 1 mg (1 mg Intravenous Given 09/07/24 1716)                                    Medical Decision Making Amount and/or Complexity of Data Reviewed Radiology: ordered and independent interpretation performed. Decision-making details documented in ED Course.  Risk Prescription drug management.   Pt with multiple medical problems and comorbidities and presenting today with a complaint that caries a high risk for morbidity and mortality.  Here today after falling down multiple stairs because of her dog pulling her.  No head injury or loss of consciousness.  Patient has significant pain in the right arm and concern for possible shoulder injury versus humeral fracture versus elbow pathology.  No pain in the forearm or wrist.  Neurovascularly intact.  Patient also having some pain in the right ribs and clavicle area.  No shortness of breath and sats are 99% on room air. superficial abrasions noted to the arm but no deep wounds.  Patient given pain control and imaging pending.  6:25 PM I have independently visualized and interpreted pt's images today.  Images of the shoulder, elbow, humerus and chest are all without fracture.  Also foot without fracture.  Radiology reports no acute fracture or dislocation.  These findings were discussed with the patient and her husband.  At this time patient is stable for discharge home.  Will place in a sling and have her follow-up with [facility] orthopedics which is her PCP.      Final diagnoses:  Fall, initial encounter  Arm contusion, right, initial encounter    ED Discharge Orders     None          Doretha Folks, MD 09/07/24 1825

## 2024-09-07 NOTE — ED Notes (Signed)
 Discharge instructions reviewed.   Opportunity for questions and concerns provided.   Alert, oriented and ambulatory.   Displays no signs of distress.   Has a safe ride home with support person.

## 2024-09-07 NOTE — ED Triage Notes (Signed)
 According to [facility]: Pt is coming from home, pt was taking her lab down stairs, lab pulled pt hard pulling her down stairs. Pt was pulled approximately 9 feet down the stairs landing with a hard impact on the cement landing on her right side with Right arm close to body on impact.pt has pain to neck, right to middle of chest, and right arm. Pt has a large deformity in upper right arm that remains closed however pt reports that it feels loose.Chest pain hurts more upon palpation and with breathing.  Pt has had an 18 piv placed by ems in left ac. Pt has had 100 mcg of fentanyl , 16 mg of ketamine over 10 minutes and 4mg  of Zofran .  C-collar not tolerated by ems after attempted, education was provided by ems.   Vitals: 158/84 Hr 84 Spo2 99% room air 20 RR

## 2024-09-07 NOTE — Progress Notes (Signed)
 Orthopedic Tech Progress Note Patient Details:  Teresa Ochoa Apr 10, 1981 [id number]  Ortho Devices Type of Ortho Device: Shoulder immobilizer Ortho Device/Splint Location: RUE Ortho Device/Splint Interventions: Application   Post Interventions Patient Tolerated: Well  Massie BRAVO Kwynn Schlotter 09/07/2024, 7:41 PM
